# Patient Record
Sex: Male | Born: 1965 | Race: White | Hispanic: No | Marital: Single | Smoking: Current every day smoker
Health system: Southern US, Community
[De-identification: ages and names within clinical notes are randomized; demographics above are authoritative.]

## PROBLEM LIST (undated history)

## (undated) DIAGNOSIS — F419 Anxiety disorder, unspecified: Secondary | ICD-10-CM

## (undated) DIAGNOSIS — K219 Gastro-esophageal reflux disease without esophagitis: Secondary | ICD-10-CM

## (undated) DIAGNOSIS — T7840XA Allergy, unspecified, initial encounter: Secondary | ICD-10-CM

## (undated) DIAGNOSIS — M1990 Unspecified osteoarthritis, unspecified site: Secondary | ICD-10-CM

## (undated) DIAGNOSIS — F32A Depression, unspecified: Secondary | ICD-10-CM

## (undated) DIAGNOSIS — I10 Essential (primary) hypertension: Secondary | ICD-10-CM

## (undated) DIAGNOSIS — M51369 Other intervertebral disc degeneration, lumbar region without mention of lumbar back pain or lower extremity pain: Secondary | ICD-10-CM

## (undated) DIAGNOSIS — M5136 Other intervertebral disc degeneration, lumbar region: Secondary | ICD-10-CM

## (undated) HISTORY — DX: Allergy, unspecified, initial encounter: T78.40XA

## (undated) HISTORY — PX: COLONOSCOPY W/ POLYPECTOMY: SHX1380

## (undated) HISTORY — DX: Depression, unspecified: F32.A

## (undated) HISTORY — DX: Anxiety disorder, unspecified: F41.9

## (undated) HISTORY — DX: Unspecified osteoarthritis, unspecified site: M19.90

## (undated) HISTORY — PX: HERNIA REPAIR: SHX51

---

## 2019-06-12 DIAGNOSIS — Z720 Tobacco use: Secondary | ICD-10-CM | POA: Diagnosis not present

## 2019-06-12 DIAGNOSIS — I10 Essential (primary) hypertension: Secondary | ICD-10-CM | POA: Diagnosis not present

## 2020-03-21 ENCOUNTER — Encounter: Payer: Self-pay | Admitting: Emergency Medicine

## 2020-03-21 ENCOUNTER — Ambulatory Visit
Admission: EM | Admit: 2020-03-21 | Discharge: 2020-03-21 | Disposition: A | Discharge status: Home / Self Care | Payer: BC Managed Care – PPO

## 2020-03-21 ENCOUNTER — Other Ambulatory Visit: Payer: Self-pay

## 2020-03-21 DIAGNOSIS — M545 Low back pain, unspecified: Secondary | ICD-10-CM

## 2020-03-21 HISTORY — DX: Essential (primary) hypertension: I10

## 2020-03-21 HISTORY — DX: Gastro-esophageal reflux disease without esophagitis: K21.9

## 2020-03-21 MED ORDER — CYCLOBENZAPRINE HCL 5 MG PO TABS: 5.0000 mg | ORAL_TABLET | Freq: Three times a day (TID) | ORAL | 0 refills | Status: DC | PRN

## 2020-03-21 MED ORDER — KETOROLAC TROMETHAMINE 60 MG/2ML IM SOLN
30.0000 mg | Freq: Once | INTRAMUSCULAR | Status: AC
Start: 2020-03-21 — End: 2020-03-21
  Administered 2020-03-21: 11:00:00 30 mg via INTRAMUSCULAR

## 2020-03-21 MED ORDER — METHYLPREDNISOLONE 4 MG PO TBPK: ORAL_TABLET | ORAL | 0 refills | Status: DC

## 2020-03-21 NOTE — ED Provider Notes (Signed)
[facility]    CSN: [id number] Arrival date & time: 03/21/20  1005      History   Chief Complaint Chief Complaint  Patient presents with  . Back Pain    HPI Ethan Snyder is a 54 y.o. male.   Ethan Snyder presents with complaints of left low back pain. Initially started around 8 days ago. No specific known trigger or injury. 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 Pain had been improving until last night. He got up from the cough and pain much worse. He experiences spasms, triggered by certain movements. Pain with hip flexion as well as with back flexion. Unable to even put his shoes on this morning due to pain. No radiation of pain. No numbness tingling or weakness. No saddle symptoms. No loss of bladder or bowel.  Took one ibuprofen last night which didn't help. Has had one similar incident in the past, but was many years ago. No midline back pain. No abdominal pain.    ROS per HPI, negative if not otherwise mentioned.      Past Medical History:  Diagnosis Date  . GERD (gastroesophageal reflux disease)   . Hypertension     There are no problems to display for this patient.   Past Surgical History:  Procedure Laterality Date  . HERNIA REPAIR         Home Medications    Prior to Admission medications   Medication Sig Start Date End Date Taking? Authorizing Provider  amLODipine (NORVASC) 10 MG tablet Take 10 mg by mouth daily. 01/09/20  Yes [provider]  loratadine (CLARITIN) 10 MG tablet Take 1 tablet by mouth daily.   Yes [provider]  losartan (COZAAR) 50 MG tablet Take 50 mg by mouth daily. 01/09/20  Yes [provider]  omeprazole (PRILOSEC OTC) 20 MG tablet Take 1 tablet by mouth daily.   Yes [provider]  cyclobenzaprine (FLEXERIL) 5 MG tablet Take 1-2 tablets (5-10 mg total) by mouth 3 (three) times daily as needed for muscle spasms. 03/21/20   Zigmund Gottron, NP   methylPREDNISolone (MEDROL DOSEPAK) 4 MG TBPK tablet Per box instructions 03/21/20   Zigmund Gottron, NP    Family History Family History  Problem Relation Age of Onset  . Healthy Mother   . Lung cancer Father     Social History Social History   Tobacco Use  . Smoking status: Current Every Day Smoker    Packs/day: 1.00    Years: 40.00    Pack years: 40.00    Types: Cigarettes  . Smokeless tobacco: Never Used  Vaping Use  . Vaping Use: Never used  Substance Use Topics  . Alcohol use: Yes    Alcohol/week: 2.0 standard drinks    Types: 2 Glasses of wine per week  . Drug use: Never     Allergies   Lisinopril, Ibuprofen, and Other   Review of Systems Review of Systems   Physical Exam Triage Vital Signs ED Triage Vitals  Enc Vitals Group     BP 03/21/20 1108 (!) 141/90     Pulse Rate 03/21/20 1108 88     Resp 03/21/20 1108 18     Temp 03/21/20 1108 98.4 F (36.9 C)     Temp Source 03/21/20 1108 Oral     SpO2 03/21/20 1108 99 %     Weight 03/21/20 1108 230 lb (104.3 kg)     Height 03/21/20 1108 6' (1.829  m)     Head Circumference --      Peak Flow --      Pain Score 03/21/20 1107 10     Pain Loc --      Pain Edu? --      Excl. in [place]? --    No data found.  Updated Vital Signs BP (!) 141/90 (BP Location: Left Arm)   Pulse 88   Temp 98.4 F (36.9 C) (Oral)   Resp 18   Ht 6' (1.829 m)   Wt 230 lb (104.3 kg)   SpO2 99%   BMI 31.19 kg/m   Visual Acuity Right Eye Distance:   Left Eye Distance:   Bilateral Distance:    Right Eye Near:   Left Eye Near:    Bilateral Near:     Physical Exam Constitutional:      Appearance: He is well-developed.  Cardiovascular:     Rate and Rhythm: Normal rate.  Pulmonary:     Effort: Pulmonary effort is normal.  Musculoskeletal:     Lumbar back: Spasms and tenderness present. No swelling or bony tenderness. Decreased range of motion. Negative right straight leg raise test and negative left straight leg raise  test.       Back:     Comments: Point tenderness to low back musculature, L>R; pain with hip flexion; pain with transition from sit to stand and stand to sit; pain with back flexion; no radiation of pain; strength equal bilaterally; gross sensation intact to lower extremities   Skin:    General: Skin is warm and dry.  Neurological:     Mental Status: He is alert and oriented to person, place, and time.      UC Treatments / Results  Labs (all labs ordered are listed, but only abnormal results are displayed) Labs Reviewed - No data to display  EKG   Radiology No results found.  Procedures Procedures (including critical care time)  Medications Ordered in UC Medications  ketorolac (TORADOL) injection 30 mg (has no administration in time range)    Initial Impression / Assessment and Plan / UC Course  I have reviewed the triage vital signs and the nursing notes.  Pertinent labs & imaging results that were available during my care of the patient were reviewed by me and considered in my medical decision making (see chart for details).     Lumbar strain and spasm with pain management plan discussed. Return precautions provided. Patient verbalized understanding and agreeable to plan. Ambulatory out of clinic.   Final Clinical Impressions(s) / UC Diagnoses   Final diagnoses:  Acute left-sided low back pain without sciatica     Discharge Instructions     Light and regular activity as tolerated.  See exercises provided.  Heat application while active can help with muscle spasms.  Sleep with pillow under your knees.   May start medrol steroid pack today. Take with food.  Flexeril as needed as a muscle relaxer. May cause drowsiness. Please do not take if driving or drinking alcohol.  May  need to take this at night if you are working during the day.  Return for any worsening of symptoms. This may take even up to a few weeks for complete resolution, however.    ED  Prescriptions    Medication Sig Dispense Auth. Provider   methylPREDNISolone (MEDROL DOSEPAK) 4 MG TBPK tablet Per box instructions 21 tablet Shigeru Lampert B, NP   cyclobenzaprine (FLEXERIL) 5 MG tablet Take 1-2 tablets (  5-10 mg total) by mouth 3 (three) times daily as needed for muscle spasms. 20 tablet Zigmund Gottron, NP     PDMP not reviewed this encounter.   Zigmund Gottron, NP 03/21/20 1145

## 2020-03-21 NOTE — Discharge Instructions (Addendum)
Light and regular activity as tolerated.  See exercises provided.  Heat application while active can help with muscle spasms.  Sleep with pillow under your knees.   May start medrol steroid pack today. Take with food.  Flexeril as needed as a muscle relaxer. May cause drowsiness. Please do not take if driving or drinking alcohol.  May  need to take this at night if you are working during the day.  Return for any worsening of symptoms. This may take even up to a few weeks for complete resolution, however.

## 2020-03-21 NOTE — ED Triage Notes (Signed)
Patient in today c/o lower back pain mainly on the left x 1 week. Patient states it got a lot worse last night. No injury noted. Patient denies any urinary symptoms. Patient took Ibuprofen and rub on pain reliever last night without relief.

## 2020-07-15 DIAGNOSIS — Z0000 Encounter for general adult medical examination without abnormal findings: Secondary | ICD-10-CM | POA: Diagnosis not present

## 2020-07-15 DIAGNOSIS — Z125 Encounter for screening for malignant neoplasm of prostate: Secondary | ICD-10-CM | POA: Diagnosis not present

## 2020-07-15 DIAGNOSIS — E785 Hyperlipidemia, unspecified: Secondary | ICD-10-CM | POA: Diagnosis not present

## 2020-07-15 DIAGNOSIS — K219 Gastro-esophageal reflux disease without esophagitis: Secondary | ICD-10-CM | POA: Diagnosis not present

## 2020-07-15 DIAGNOSIS — I10 Essential (primary) hypertension: Secondary | ICD-10-CM | POA: Diagnosis not present

## 2020-07-15 DIAGNOSIS — Z1159 Encounter for screening for other viral diseases: Secondary | ICD-10-CM | POA: Diagnosis not present

## 2020-07-15 LAB — HM HEPATITIS C SCREENING LAB: HM Hepatitis Screen: NEGATIVE

## 2020-11-22 ENCOUNTER — Ambulatory Visit: Payer: BC Managed Care – PPO | Admitting: Internal Medicine

## 2020-12-28 ENCOUNTER — Ambulatory Visit
Admission: RE | Admit: 2020-12-28 | Discharge: 2020-12-28 | Disposition: A | Discharge status: Home / Self Care | Payer: BC Managed Care – PPO | Source: Ambulatory Visit | Attending: Internal Medicine | Admitting: Internal Medicine

## 2020-12-28 ENCOUNTER — Encounter: Payer: Self-pay | Admitting: Internal Medicine

## 2020-12-28 ENCOUNTER — Other Ambulatory Visit: Payer: Self-pay

## 2020-12-28 ENCOUNTER — Ambulatory Visit: Payer: BC Managed Care – PPO | Admitting: Internal Medicine

## 2020-12-28 ENCOUNTER — Ambulatory Visit
Admission: RE | Admit: 2020-12-28 | Discharge: 2020-12-28 | Disposition: A | Discharge status: Home / Self Care | Payer: BC Managed Care – PPO | Attending: Internal Medicine | Admitting: Internal Medicine

## 2020-12-28 VITALS — BP 121/73 | HR 71 | Temp 98.2°F | Ht 71.5 in | Wt 217.0 lb

## 2020-12-28 DIAGNOSIS — M5442 Lumbago with sciatica, left side: Secondary | ICD-10-CM | POA: Insufficient documentation

## 2020-12-28 DIAGNOSIS — I10 Essential (primary) hypertension: Secondary | ICD-10-CM | POA: Diagnosis not present

## 2020-12-28 DIAGNOSIS — M5441 Lumbago with sciatica, right side: Secondary | ICD-10-CM

## 2020-12-28 DIAGNOSIS — Z122 Encounter for screening for malignant neoplasm of respiratory organs: Secondary | ICD-10-CM | POA: Diagnosis not present

## 2020-12-28 DIAGNOSIS — M545 Low back pain, unspecified: Secondary | ICD-10-CM | POA: Diagnosis not present

## 2020-12-28 DIAGNOSIS — G8929 Other chronic pain: Secondary | ICD-10-CM

## 2020-12-28 DIAGNOSIS — Z1211 Encounter for screening for malignant neoplasm of colon: Secondary | ICD-10-CM | POA: Diagnosis not present

## 2020-12-28 LAB — URINALYSIS, ROUTINE W REFLEX MICROSCOPIC
Bilirubin, UA: NEGATIVE
Glucose, UA: NEGATIVE
Ketones, UA: NEGATIVE
Leukocytes,UA: NEGATIVE
Nitrite, UA: NEGATIVE
Protein,UA: NEGATIVE
RBC, UA: NEGATIVE
Specific Gravity, UA: 1.025 (ref 1.005–1.030)
Urobilinogen, Ur: 0.2 mg/dL (ref 0.2–1.0)
pH, UA: 5.5 (ref 5.0–7.5)

## 2020-12-28 MED ORDER — NICOTINE 21 MG/24HR TD PT24: 21.0000 mg | MEDICATED_PATCH | Freq: Every day | TRANSDERMAL | 0 refills | Status: DC

## 2020-12-28 MED ORDER — CYCLOBENZAPRINE HCL 10 MG PO TABS: 10.0000 mg | ORAL_TABLET | Freq: Every day | ORAL | 0 refills | Status: AC

## 2020-12-28 MED ORDER — METHYLPREDNISOLONE 4 MG PO TBPK: ORAL_TABLET | ORAL | 0 refills | Status: DC

## 2020-12-28 NOTE — Progress Notes (Signed)
BP 121/73   Pulse 71   Temp 98.2 F (36.8 C) (Oral)   Ht 5' 11.5" (1.816 m)   Wt 217 lb (98.4 kg)   SpO2 98%   BMI 29.84 kg/m    Subjective:    Patient ID: Ethan Snyder, male    DOB: 1966-02-13, 55 y.o.   MRN: [id number]  Chief Complaint  Patient presents with   New Patient (Initial Visit)    Pt states he has been having issues with his back bothering him for a while.     HPI: Ethan Snyder is a 55 y.o. male  Moved from [place]. Going back and forth. Worked in apt business. Lives in [place] now. Has lost 20 lbs x 6 months involuntarily  Back Pain This is a chronic (has a ho such, started x 7 - 8 months ago , got worse had spasms, was miserable - saw UC @ [facility] - didnt have imaging - got a steorid dose pak tht helped. has pain in lower back.) problem. The current episode started more than 1 year ago (has had pain in the bil lower legs sec to radiation from the lower back). Pertinent negatives include no chest pain or headaches.  Hypertension This is a chronic (has bene on norvasc and losartan for such) problem. The current episode started more than 1 year ago. The problem has been gradually improving since onset. The problem is controlled. Pertinent negatives include no anxiety, blurred vision, chest pain, headaches, malaise/fatigue, neck pain, orthopnea, palpitations, peripheral edema, PND, shortness of breath or sweats.  Gastroesophageal Reflux He reports no chest pain.   Chief Complaint  Patient presents with   New Patient (Initial Visit)    Pt states he has been having issues with his back bothering him for a while.     Relevant past medical, surgical, family and social history reviewed and updated as indicated. Interim medical history since our last visit reviewed. Allergies and medications reviewed and updated.  Review of Systems  Constitutional:  Negative for malaise/fatigue.  Eyes:  Negative for blurred vision.  Respiratory:  Negative for shortness of  breath.   Cardiovascular:  Negative for chest pain, palpitations, orthopnea and PND.  Musculoskeletal:  Positive for back pain. Negative for neck pain.  Neurological:  Negative for headaches.   Per HPI unless specifically indicated above     Objective:    BP 121/73   Pulse 71   Temp 98.2 F (36.8 C) (Oral)   Ht 5' 11.5" (1.816 m)   Wt 217 lb (98.4 kg)   SpO2 98%   BMI 29.84 kg/m   Wt Readings from Last 3 Encounters:  12/28/20 217 lb (98.4 kg)  03/21/20 230 lb (104.3 kg)    Physical Exam Vitals and nursing note reviewed.  Constitutional:      General: He is not in acute distress.    Appearance: Normal appearance. He is not ill-appearing or diaphoretic.  HENT:     Head: Normocephalic and atraumatic.     Right Ear: Tympanic membrane and external ear normal. There is no impacted cerumen.     Left Ear: External ear normal.     Nose: No congestion or rhinorrhea.     Mouth/Throat:     Pharynx: No oropharyngeal exudate or posterior oropharyngeal erythema.  Eyes:     Conjunctiva/sclera: Conjunctivae normal.     Pupils: Pupils are equal, round, and reactive to light.  Cardiovascular:     Rate and Rhythm: Normal rate and regular  rhythm.     Heart sounds: No murmur heard.   No friction rub. No gallop.  Pulmonary:     Effort: No respiratory distress.     Breath sounds: No stridor. No wheezing or rhonchi.  Chest:     Chest wall: No tenderness.  Abdominal:     General: Abdomen is flat. Bowel sounds are normal.     Palpations: Abdomen is soft. There is no mass.     Tenderness: There is no abdominal tenderness.  Musculoskeletal:     Cervical back: Normal range of motion and neck supple. No rigidity or tenderness.     Left lower leg: No edema.  Skin:    General: Skin is warm and dry.  Neurological:     Mental Status: He is alert.    No results found for this or any previous visit.      Current Outpatient Medications:    amLODipine (NORVASC) 10 MG tablet, Take 10 mg by  mouth daily., Disp: , Rfl:    loratadine (CLARITIN) 10 MG tablet, Take 1 tablet by mouth daily., Disp: , Rfl:    losartan (COZAAR) 50 MG tablet, Take 50 mg by mouth daily., Disp: , Rfl:    omeprazole (PRILOSEC OTC) 20 MG tablet, Take 1 tablet by mouth daily., Disp: , Rfl:     Assessment & Plan:  Back pain start pt on medrol dose pak   probably secondary to muscle spasms relieved check UTI patient does not have any symptoms either. will need ? MRI of back if pain persists.  most likely musckulskelteal though vs L spine / nerve compression  adviced streches for back  Patient advised to take medication as directed here. Patient advised to rest initially and then slowly increase activity level. Monitor changes in symptoms such as numbness, tingling or weakness in legs, changes in bowel or bladder habits or worsening back pain. Proper ergonomics discussed. Referral to physical therapy as needed. Patient will call if symptoms worsen or if pain persists greater than 8 weeks.    HTN HTN :  Continue current meds.  Medication compliance emphasised. pt advised to keep Bp logs. Pt verbalised understanding of the same. Pt to have a low salt diet . Exercise to reach a goal of at least 150 mins a week.  lifestyle modifications explained and pt understands importance of the above.  Lung cacner screening    Orders Placed This Encounter  Procedures   DG Lumbar Spine Complete    Standing Status:   Future    Standing Expiration Date:   02/27/2021    Order Specific Question:   Reason for Exam (SYMPTOM  OR DIAGNOSIS REQUIRED)    Answer:   lower back pain with sciatica.    Order Specific Question:   Preferred imaging location?    Answer:   [facility]   Ambulatory referral to Orthopedics    Referral Priority:   Routine    Referral Type:   Consultation    Number of Visits Requested:   1   2. Healht Maintenence : PSA : last 6 months ago was nl per pt. @ [facility]  Cscope :10 yrs ago   3. Smoking  cesation Smoking cessation advised. Insurance didn't cover chantix Low dose CT chest ordered   nicotine patches in the past. continues to smoke. more than > 5 - 10 mins of time was spent with pt regarding smoking cessation and complications.    4. 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  Will check Cscoape Low dose CT chest.  Labs, Including PSA Screen for cancer.    Total time spent with patient was > 30 mins including charting and cordinating care/ making reerrals.      Problem List Items Addressed This Visit   None    No orders of the defined types were placed in this encounter.    No orders of the defined types were placed in this encounter.    Follow up plan: No follow-ups on file.

## 2020-12-28 NOTE — Progress Notes (Signed)
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  If pain worse or gets intolerable, needs an Mri  Thnx.

## 2020-12-28 NOTE — Patient Instructions (Signed)

## 2020-12-29 ENCOUNTER — Telehealth: Payer: Self-pay

## 2020-12-29 LAB — LIPID PANEL
Chol/HDL Ratio: 5.1 ratio — ABNORMAL HIGH (ref 0.0–5.0)
Cholesterol, Total: 190 mg/dL (ref 100–199)
HDL: 37 mg/dL — ABNORMAL LOW (ref >39)
LDL Chol Calc (NIH): 127 mg/dL — ABNORMAL HIGH (ref 0–99)
Triglycerides: 145 mg/dL (ref 0–149)
VLDL Cholesterol Cal: 26 mg/dL (ref 5–40)

## 2020-12-29 LAB — COMPREHENSIVE METABOLIC PANEL
ALT: 18 IU/L (ref 0–44)
AST: 17 IU/L (ref 0–40)
Albumin/Globulin Ratio: 2.2 (ref 1.2–2.2)
Albumin: 4.8 g/dL (ref 3.8–4.9)
Alkaline Phosphatase: 83 IU/L (ref 44–121)
BUN/Creatinine Ratio: 11 (ref 9–20)
BUN: 12 mg/dL (ref 6–24)
Bilirubin Total: 0.4 mg/dL (ref 0.0–1.2)
CO2: 22 mmol/L (ref 20–29)
Calcium: 9.5 mg/dL (ref 8.7–10.2)
Chloride: 101 mmol/L (ref 96–106)
Creatinine, Ser: 1.08 mg/dL (ref 0.76–1.27)
Globulin, Total: 2.2 g/dL (ref 1.5–4.5)
Glucose: 103 mg/dL — ABNORMAL HIGH (ref 65–99)
Potassium: 4.6 mmol/L (ref 3.5–5.2)
Sodium: 141 mmol/L (ref 134–144)
Total Protein: 7 g/dL (ref 6.0–8.5)
eGFR: 81 mL/min/{1.73_m2} (ref >59)

## 2020-12-29 LAB — CBC WITH DIFFERENTIAL/PLATELET
Basophils Absolute: 0 10*3/uL (ref 0.0–0.2)
Basos: 1 %
EOS (ABSOLUTE): 0 10*3/uL (ref 0.0–0.4)
Eos: 1 %
Hematocrit: 49.8 % (ref 37.5–51.0)
Hemoglobin: 16.5 g/dL (ref 13.0–17.7)
Immature Grans (Abs): 0 10*3/uL (ref 0.0–0.1)
Immature Granulocytes: 0 %
Lymphocytes Absolute: 1.6 10*3/uL (ref 0.7–3.1)
Lymphs: 27 %
MCH: 27.7 pg (ref 26.6–33.0)
MCHC: 33.1 g/dL (ref 31.5–35.7)
MCV: 84 fL (ref 79–97)
Monocytes Absolute: 0.6 10*3/uL (ref 0.1–0.9)
Monocytes: 10 %
Neutrophils Absolute: 3.7 10*3/uL (ref 1.4–7.0)
Neutrophils: 61 %
Platelets: 211 10*3/uL (ref 150–450)
RBC: 5.96 x10E6/uL — ABNORMAL HIGH (ref 4.14–5.80)
RDW: 13.1 % (ref 11.6–15.4)
WBC: 6 10*3/uL (ref 3.4–10.8)

## 2020-12-29 LAB — PSA: Prostate Specific Ag, Serum: 1.1 ng/mL (ref 0.0–4.0)

## 2020-12-29 LAB — TSH: TSH: 1.8 u[IU]/mL (ref 0.450–4.500)

## 2020-12-29 NOTE — Telephone Encounter (Signed)
PATIENT WILL CALL us BACK WHEN HE IS READY TO SCHEDULE

## 2021-01-03 ENCOUNTER — Telehealth: Payer: Self-pay | Admitting: Internal Medicine

## 2021-01-03 NOTE — Telephone Encounter (Signed)
done

## 2021-01-03 NOTE — Telephone Encounter (Signed)
Pt is calling to check on the status of the Ambulatory referral to Orthopedics (Order # [id number]) on 12/28/2020 Please advise CB- [phone]

## 2021-01-03 NOTE — Telephone Encounter (Signed)
Dr. Neomia Dear, can you finish this note as soon as you get a chance please? Ethan Snyder is waiting on it to be able to send this referral.

## 2021-01-04 NOTE — Telephone Encounter (Signed)
Called and notified patient. He states that he has already has an appointment with them on 01/11/21

## 2021-01-11 DIAGNOSIS — K4090 Unilateral inguinal hernia, without obstruction or gangrene, not specified as recurrent: Secondary | ICD-10-CM | POA: Diagnosis not present

## 2021-01-11 DIAGNOSIS — M5416 Radiculopathy, lumbar region: Secondary | ICD-10-CM | POA: Diagnosis not present

## 2021-01-12 ENCOUNTER — Other Ambulatory Visit: Payer: Self-pay

## 2021-01-12 ENCOUNTER — Encounter: Payer: Self-pay | Admitting: Internal Medicine

## 2021-01-12 ENCOUNTER — Ambulatory Visit: Payer: BC Managed Care – PPO | Admitting: Internal Medicine

## 2021-01-12 VITALS — BP 133/84 | HR 80 | Temp 98.6°F | Ht 71.5 in | Wt 211.8 lb

## 2021-01-12 DIAGNOSIS — E781 Pure hyperglyceridemia: Secondary | ICD-10-CM | POA: Diagnosis not present

## 2021-01-12 DIAGNOSIS — M5441 Lumbago with sciatica, right side: Secondary | ICD-10-CM

## 2021-01-12 DIAGNOSIS — I700 Atherosclerosis of aorta: Secondary | ICD-10-CM

## 2021-01-12 DIAGNOSIS — M5442 Lumbago with sciatica, left side: Secondary | ICD-10-CM

## 2021-01-12 DIAGNOSIS — I10 Essential (primary) hypertension: Secondary | ICD-10-CM | POA: Insufficient documentation

## 2021-01-12 DIAGNOSIS — G8929 Other chronic pain: Secondary | ICD-10-CM | POA: Insufficient documentation

## 2021-01-12 MED ORDER — LOSARTAN POTASSIUM 50 MG PO TABS: 50.0000 mg | ORAL_TABLET | Freq: Every day | ORAL | 3 refills | Status: DC

## 2021-01-12 NOTE — Progress Notes (Signed)
BP 133/84   Pulse 80   Temp 98.6 F (37 C) (Oral)   Ht 5' 11.5" (1.816 m)   Wt 211 lb 12.8 oz (96.1 kg)   SpO2 100%   BMI 29.13 kg/m    Subjective:    Patient ID: Ethan Snyder, male    DOB: 05-13-1965, 55 y.o.   MRN: [id number]  Chief Complaint  Patient presents with   Hypertension   Back Pain    HPI: Ethan Snyder is a 55 y.o. male  Hypertension This is a chronic problem. The current episode started more than 1 year ago. The problem has been rapidly improving since onset. The problem is controlled. Pertinent negatives include no anxiety, blurred vision, chest pain, headaches, malaise/fatigue, neck pain, orthopnea, palpitations, peripheral edema, PND or shortness of breath.  Back Pain This is a chronic (seen by [facility] has mild DJD of the spine.) problem. The current episode started more than 1 year ago. The pain is at a severity of 4/10. Pertinent negatives include no abdominal pain, bladder incontinence, bowel incontinence, chest pain, headaches, numbness, paresis, paresthesias or pelvic pain.   Chief Complaint  Patient presents with   Hypertension   Back Pain    Relevant past medical, surgical, family and social history reviewed and updated as indicated. Interim medical history since our last visit reviewed. Allergies and medications reviewed and updated.  Review of Systems  Constitutional:  Negative for malaise/fatigue.  Eyes:  Negative for blurred vision.  Respiratory:  Negative for shortness of breath.   Cardiovascular:  Negative for chest pain, palpitations, orthopnea and PND.  Gastrointestinal:  Negative for abdominal pain and bowel incontinence.  Genitourinary:  Negative for bladder incontinence and pelvic pain.  Musculoskeletal:  Positive for back pain. Negative for neck pain.  Neurological:  Negative for numbness, headaches and paresthesias.   Per HPI unless specifically indicated above     Objective:    BP 133/84   Pulse 80    Temp 98.6 F (37 C) (Oral)   Ht 5' 11.5" (1.816 m)   Wt 211 lb 12.8 oz (96.1 kg)   SpO2 100%   BMI 29.13 kg/m   Wt Readings from Last 3 Encounters:  01/12/21 211 lb 12.8 oz (96.1 kg)  12/28/20 217 lb (98.4 kg)  03/21/20 230 lb (104.3 kg)    Physical Exam Vitals and nursing note reviewed.  Constitutional:      General: He is not in acute distress.    Appearance: Normal appearance. He is not ill-appearing or diaphoretic.  HENT:     Head: Normocephalic and atraumatic.     Right Ear: Tympanic membrane and external ear normal. There is no impacted cerumen.     Left Ear: External ear normal.     Nose: No congestion or rhinorrhea.     Mouth/Throat:     Pharynx: No oropharyngeal exudate or posterior oropharyngeal erythema.  Eyes:     Conjunctiva/sclera: Conjunctivae normal.     Pupils: Pupils are equal, round, and reactive to light.  Cardiovascular:     Rate and Rhythm: Normal rate and regular rhythm.     Heart sounds: No murmur heard.   No friction rub. No gallop.  Pulmonary:     Effort: No respiratory distress.     Breath sounds: No stridor. No wheezing or rhonchi.  Chest:     Chest wall: No tenderness.  Abdominal:     General: Abdomen is flat. Bowel sounds are normal.  Palpations: Abdomen is soft. There is no mass.     Tenderness: There is no abdominal tenderness.  Musculoskeletal:     Cervical back: Normal range of motion and neck supple. No rigidity or tenderness.     Left lower leg: No edema.  Skin:    General: Skin is warm and dry.  Neurological:     Mental Status: He is alert.    Results for orders placed or performed in visit on 12/28/20  TSH  Result Value Ref Range   TSH 1.800 0.450 - 4.500 uIU/mL  PSA  Result Value Ref Range   Prostate Specific Ag, Serum 1.1 0.0 - 4.0 ng/mL  Lipid panel  Result Value Ref Range   Cholesterol, Total 190 100 - 199 mg/dL   Triglycerides 145 0 - 149 mg/dL   HDL 37 (L) >39 mg/dL   VLDL Cholesterol Cal 26 5 - 40 mg/dL    LDL Chol Calc (NIH) 127 (H) 0 - 99 mg/dL   Chol/HDL Ratio 5.1 (H) 0.0 - 5.0 ratio  CBC with Differential/Platelet  Result Value Ref Range   WBC 6.0 3.4 - 10.8 x10E3/uL   RBC 5.96 (H) 4.14 - 5.80 x10E6/uL   Hemoglobin 16.5 13.0 - 17.7 g/dL   Hematocrit 49.8 37.5 - 51.0 %   MCV 84 79 - 97 fL   MCH 27.7 26.6 - 33.0 pg   MCHC 33.1 31.5 - 35.7 g/dL   RDW 13.1 11.6 - 15.4 %   Platelets 211 150 - 450 x10E3/uL   Neutrophils 61 Not Estab. %   Lymphs 27 Not Estab. %   Monocytes 10 Not Estab. %   Eos 1 Not Estab. %   Basos 1 Not Estab. %   Neutrophils Absolute 3.7 1.4 - 7.0 x10E3/uL   Lymphocytes Absolute 1.6 0.7 - 3.1 x10E3/uL   Monocytes Absolute 0.6 0.1 - 0.9 x10E3/uL   EOS (ABSOLUTE) 0.0 0.0 - 0.4 x10E3/uL   Basophils Absolute 0.0 0.0 - 0.2 x10E3/uL   Immature Granulocytes 0 Not Estab. %   Immature Grans (Abs) 0.0 0.0 - 0.1 x10E3/uL  Comprehensive metabolic panel  Result Value Ref Range   Glucose 103 (H) 65 - 99 mg/dL   BUN 12 6 - 24 mg/dL   Creatinine, Ser 1.08 0.76 - 1.27 mg/dL   eGFR 81 >59 mL/min/1.73   BUN/Creatinine Ratio 11 9 - 20   Sodium 141 134 - 144 mmol/L   Potassium 4.6 3.5 - 5.2 mmol/L   Chloride 101 96 - 106 mmol/L   CO2 22 20 - 29 mmol/L   Calcium 9.5 8.7 - 10.2 mg/dL   Total Protein 7.0 6.0 - 8.5 g/dL   Albumin 4.8 3.8 - 4.9 g/dL   Globulin, Total 2.2 1.5 - 4.5 g/dL   Albumin/Globulin Ratio 2.2 1.2 - 2.2   Bilirubin Total 0.4 0.0 - 1.2 mg/dL   Alkaline Phosphatase 83 44 - 121 IU/L   AST 17 0 - 40 IU/L   ALT 18 0 - 44 IU/L  Urinalysis, Routine w reflex microscopic  Result Value Ref Range   Specific Gravity, UA 1.025 1.005 - 1.030   pH, UA 5.5 5.0 - 7.5   Color, UA Yellow Yellow   Appearance Ur Clear Clear   Leukocytes,UA Negative Negative   Protein,UA Negative Negative/Trace   Glucose, UA Negative Negative   Ketones, UA Negative Negative   RBC, UA Negative Negative   Bilirubin, UA Negative Negative   Urobilinogen, Ur 0.2 0.2 - 1.0 mg/dL  Nitrite, UA  Negative Negative        Current Outpatient Medications:    amLODipine (NORVASC) 10 MG tablet, Take 10 mg by mouth daily., Disp: , Rfl:    loratadine (CLARITIN) 10 MG tablet, Take 1 tablet by mouth daily., Disp: , Rfl:    losartan (COZAAR) 50 MG tablet, Take 50 mg by mouth daily., Disp: , Rfl:    nicotine (NICODERM CQ) 21 mg/24hr patch, Place 1 patch (21 mg total) onto the skin daily., Disp: 28 patch, Rfl: 0   omeprazole (PRILOSEC OTC) 20 MG tablet, Take 1 tablet by mouth daily., Disp: , Rfl:    gabapentin (NEURONTIN) 300 MG capsule, Take 300 mg by mouth 3 (three) times daily., Disp: , Rfl:     Assessment & Plan:  Mild DJD of L spine seeing [facility] for such, suggested PT  - is on neurontin 300 mg tid to change to  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 HTN : is on losartan 50 mg and amlodipine for such  Continue current meds.  Medication compliance emphasised. pt advised to keep Bp logs. Pt verbalised understanding of the same. Pt to have a low salt diet . Exercise to reach a goal of at least 150 mins a week.  lifestyle modifications explained and pt understands importance of the above.  3. Xray L spine : Aortic atherosclerosis. Stopping smoking would help with such , continues to smoke but cuting back is on 1/2 ppd. Is on nicoderm patches  Consider statins pt will try lifestyle modifications inclyuding stopping smoking and fu with me    Problem List Items Addressed This Visit   None    Orders Placed This Encounter  Procedures   Lipid Panel Piccolo, Waived   Comprehensive metabolic panel   Comprehensive metabolic panel   Urinalysis, Routine w reflex microscopic     Meds ordered this encounter  Medications   DISCONTD: losartan (COZAAR) 50 MG tablet    Sig: Take 1 tablet (50 mg total) by mouth daily.    Dispense:  90 tablet    Refill:  3   losartan (COZAAR) 50 MG tablet    Sig: Take 1 tablet (50 mg total) by mouth daily.    Dispense:  90 tablet    Refill:  3     Follow  up plan: No follow-ups on file.

## 2021-01-13 DIAGNOSIS — E781 Pure hyperglyceridemia: Secondary | ICD-10-CM | POA: Insufficient documentation

## 2021-01-13 DIAGNOSIS — I700 Atherosclerosis of aorta: Secondary | ICD-10-CM | POA: Insufficient documentation

## 2021-02-01 DIAGNOSIS — M545 Low back pain, unspecified: Secondary | ICD-10-CM | POA: Diagnosis not present

## 2021-02-08 DIAGNOSIS — M545 Low back pain, unspecified: Secondary | ICD-10-CM | POA: Diagnosis not present

## 2021-02-09 ENCOUNTER — Other Ambulatory Visit: Payer: Self-pay | Admitting: *Deleted

## 2021-02-09 DIAGNOSIS — F17210 Nicotine dependence, cigarettes, uncomplicated: Secondary | ICD-10-CM

## 2021-02-21 DIAGNOSIS — M545 Low back pain, unspecified: Secondary | ICD-10-CM | POA: Diagnosis not present

## 2021-02-25 DIAGNOSIS — M545 Low back pain, unspecified: Secondary | ICD-10-CM | POA: Diagnosis not present

## 2021-02-25 DIAGNOSIS — M25552 Pain in left hip: Secondary | ICD-10-CM | POA: Diagnosis not present

## 2021-02-25 DIAGNOSIS — M25551 Pain in right hip: Secondary | ICD-10-CM | POA: Diagnosis not present

## 2021-02-28 DIAGNOSIS — M545 Low back pain, unspecified: Secondary | ICD-10-CM | POA: Diagnosis not present

## 2021-03-07 ENCOUNTER — Telehealth: Payer: Self-pay | Admitting: Acute Care

## 2021-03-07 NOTE — Telephone Encounter (Signed)
Spoke with pt and rescheduled shared decision visit and Low dose CT.  PT verbalized understanding. Nothing further needed.

## 2021-03-08 ENCOUNTER — Ambulatory Visit: Payer: BC Managed Care – PPO

## 2021-03-08 ENCOUNTER — Telehealth: Payer: BC Managed Care – PPO | Admitting: Acute Care

## 2021-03-30 ENCOUNTER — Other Ambulatory Visit: Payer: Self-pay

## 2021-03-30 ENCOUNTER — Ambulatory Visit
Admission: RE | Admit: 2021-03-30 | Discharge: 2021-03-30 | Disposition: A | Discharge status: Home / Self Care | Payer: BC Managed Care – PPO | Source: Ambulatory Visit | Attending: Acute Care | Admitting: Acute Care

## 2021-03-30 ENCOUNTER — Encounter: Payer: Self-pay | Admitting: Acute Care

## 2021-03-30 ENCOUNTER — Ambulatory Visit (INDEPENDENT_AMBULATORY_CARE_PROVIDER_SITE_OTHER): Payer: BC Managed Care – PPO | Admitting: Acute Care

## 2021-03-30 DIAGNOSIS — F17210 Nicotine dependence, cigarettes, uncomplicated: Secondary | ICD-10-CM

## 2021-03-30 DIAGNOSIS — Z122 Encounter for screening for malignant neoplasm of respiratory organs: Secondary | ICD-10-CM

## 2021-03-30 NOTE — Progress Notes (Signed)
Virtual Visit via Video Note  I connected with Nancy Nordmann on 03/30/21 at 10:00 AM EST by a video enabled telemedicine application and verified that I am speaking with the correct person using two identifiers.  Location: Patient: At home Provider:  [place], [place], [place], [address]    I discussed the limitations of evaluation and management by telemedicine and the availability of in person appointments. The patient expressed understanding and agreed to proceed.  Pt. Was unable to make the video connection with our platform, so the visit was completed as a Engineer, [employer] Making Visit Lung Cancer Screening Program [phone])   Eligibility: Age 55 y.o. Pack Years Smoking History Calculation 48 pack year smoking history (# packs/per year x # years smoked) Recent History of coughing up blood  no Unexplained weight loss? no ( >Than 15 pounds within the last 6 months ) Prior History Lung / other cancer no (Diagnosis within the last 5 years already requiring surveillance chest CT Scans). Smoking Status Current Smoker Former Smokers: Years since quit:  NA  Quit Date: NA  Visit Components: Discussion included one or more decision making aids. yes Discussion included risk/benefits of screening. yes Discussion included potential follow up diagnostic testing for abnormal scans. yes Discussion included meaning and risk of over diagnosis. yes Discussion included meaning and risk of False Positives. yes Discussion included meaning of total radiation exposure. yes  Counseling Included: Importance of adherence to annual lung cancer LDCT screening. yes Impact of comorbidities on ability to participate in the program. yes Ability and willingness to under diagnostic treatment. yes  Smoking Cessation Counseling: Current Smokers:  Discussed importance of smoking cessation. yes Information about tobacco cessation classes and interventions provided to patient.  yes Patient provided with "ticket" for LDCT Scan. yes Symptomatic Patient. no  Counseling NA Diagnosis Code: Tobacco Use Z72.0 Asymptomatic Patient yes  Counseling (Intermediate counseling: > three minutes counseling) [id number] Former Smokers:  Discussed the importance of maintaining cigarette abstinence. yes Diagnosis Code: Personal History of Nicotine Dependence. Y86.578 Information about tobacco cessation classes and interventions provided to patient. Yes Patient provided with "ticket" for LDCT Scan. yes Written Order for Lung Cancer Screening with LDCT placed in Epic. Yes (CT Chest Lung Cancer Screening Low Dose W/O CM) [id number] Z12.2-Screening of respiratory organs Z87.891-Personal history of nicotine dependence  I have spent 25 minutes of face to face/ virtual visit   time with Mr. Senat discussing the risks and benefits of lung cancer screening. We viewed / discussed a power point together that explained in detail the above noted topics. We paused at intervals to allow for questions to be asked and answered to ensure understanding.We discussed that the single most powerful action that he can take to decrease his risk of developing lung cancer is to quit smoking. We discussed whether or not he is ready to commit to setting a quit date. We discussed options for tools to aid in quitting smoking including nicotine replacement therapy, non-nicotine medications, support groups, Quit Smart classes, and behavior modification. We discussed that often times setting smaller, more achievable goals, such as eliminating 1 cigarette a day for a week and then 2 cigarettes a day for a week can be helpful in slowly decreasing the number of cigarettes smoked. This allows for a sense of accomplishment as well as providing a clinical benefit. I provided  him  with smoking cessation  information  with contact information for community resources, classes, free nicotine replacement therapy, and access  to mobile apps,  text messaging, and on-line smoking cessation help. I have also provided  him  the office contact information in the event he needs to contact me, or the screening staff. We discussed the time and location of the scan, and that either Doroteo Glassman RN, Joella Prince, RN  or I will call / send a letter with the results within 24-72 hours of receiving them. The patient verbalized understanding of all of  the above and had no further questions upon leaving the office. They have my contact information in the event they have any further questions.  I spent 3 minutes counseling on smoking cessation and the health risks of continued tobacco abuse.  I explained to the patient that there has been a high incidence of coronary artery disease noted on these exams. I explained that this is a non-gated exam therefore degree or severity cannot be determined. This patient is not on statin therapy. I have asked the patient to follow-up with their PCP regarding any incidental finding of coronary artery disease and management with diet or medication as their PCP  feels is clinically indicated. The patient verbalized understanding of the above and had no further questions upon completion of the visit.      Magdalen Spatz, NP 03/30/2021

## 2021-03-30 NOTE — Patient Instructions (Signed)
Thank you for participating in the [facility]. °It was our pleasure to meet you today. °We will call you with the results of your scan within the next few days. °Your scan will be assigned a Lung RADS category score by the physicians reading the scans.  °This Lung RADS score determines follow up scanning.  °See below for description of categories, and follow up screening recommendations. °We will be in touch to schedule your follow up screening annually or based on recommendations of our providers. °We will fax a copy of your scan results to your Primary Care Physician, or the physician who referred you to the program, to ensure they have the results. °Please call the office if you have any questions or concerns regarding your scanning experience or results.  °Our office number is [phone]. °Please speak with Denise Phelps, RN. She is our Lung Cancer Screening RN. °If she is unavailable when you call, please have the office staff send her a message. She will return your call at her earliest convenience. °Remember, if your scan is normal, we will scan you annually as long as you continue to meet the criteria for the program. (Age 55-77, Current smoker or smoker who has quit within the last 15 years). °If you are a smoker, remember, quitting is the single most powerful action that you can take to decrease your risk of lung cancer and other pulmonary, breathing related problems. °We know quitting is hard, and we are here to help.  °Please let us know if there is anything we can do to help you meet your goal of quitting. °If you are a former smoker, congratulations. We are proud of you! Remain smoke free! °Remember you can refer friends or family members through the number above.  °We will screen them to make sure they meet criteria for the program. °Thank you for helping us take better care of you by participating in Lung Screening. ° °You can receive free nicotine replacement therapy  ( patches, gum or mints) by calling 1-800-QUIT NOW. Please call so we can get you on the path to becoming  a non-smoker. I know it is hard, but you can do this! ° °Lung RADS Categories: ° °Lung RADS 1: no nodules or definitely non-concerning nodules.  °Recommendation is for a repeat annual scan in 12 months. ° °Lung RADS 2:  nodules that are non-concerning in appearance and behavior with a very low likelihood of becoming an active cancer. °Recommendation is for a repeat annual scan in 12 months. ° °Lung RADS 3: nodules that are probably non-concerning , includes nodules with a low likelihood of becoming an active cancer.  Recommendation is for a 6-month repeat screening scan. Often noted after an upper respiratory illness. We will be in touch to make sure you have no questions, and to schedule your 6-month scan. ° °Lung RADS 4 A: nodules with concerning findings, recommendation is most often for a follow up scan in 3 months or additional testing based on our provider's assessment of the scan. We will be in touch to make sure you have no questions and to schedule the recommended 3 month follow up scan. ° °Lung RADS 4 B:  indicates findings that are concerning. We will be in touch with you to schedule additional diagnostic testing based on our provider's  assessment of the scan. ° °Hypnosis for smoking cessation  °[employer] °[phone] ° °Acupuncture for smoking cessation  °[facility] °[phone]  °

## 2021-04-06 ENCOUNTER — Other Ambulatory Visit: Payer: Self-pay | Admitting: Acute Care

## 2021-04-06 DIAGNOSIS — Z87891 Personal history of nicotine dependence: Secondary | ICD-10-CM

## 2021-04-06 DIAGNOSIS — F17210 Nicotine dependence, cigarettes, uncomplicated: Secondary | ICD-10-CM

## 2021-04-07 ENCOUNTER — Other Ambulatory Visit: Payer: Self-pay

## 2021-04-07 ENCOUNTER — Other Ambulatory Visit: Payer: BC Managed Care – PPO

## 2021-04-07 DIAGNOSIS — F17210 Nicotine dependence, cigarettes, uncomplicated: Secondary | ICD-10-CM

## 2021-04-07 DIAGNOSIS — I10 Essential (primary) hypertension: Secondary | ICD-10-CM | POA: Diagnosis not present

## 2021-04-07 DIAGNOSIS — E781 Pure hyperglyceridemia: Secondary | ICD-10-CM

## 2021-04-07 DIAGNOSIS — Z87891 Personal history of nicotine dependence: Secondary | ICD-10-CM

## 2021-04-08 LAB — URINALYSIS, ROUTINE W REFLEX MICROSCOPIC
Bilirubin, UA: NEGATIVE
Glucose, UA: NEGATIVE
Leukocytes,UA: NEGATIVE
Nitrite, UA: NEGATIVE
Protein,UA: NEGATIVE
RBC, UA: NEGATIVE
Specific Gravity, UA: >1.03 — ABNORMAL HIGH (ref 1.005–1.030)
Urobilinogen, Ur: 0.2 mg/dL (ref 0.2–1.0)
pH, UA: 5.5 (ref 5.0–7.5)

## 2021-04-08 LAB — COMPREHENSIVE METABOLIC PANEL
ALT: 19 IU/L (ref 0–44)
AST: 19 IU/L (ref 0–40)
Albumin/Globulin Ratio: 2.3 — ABNORMAL HIGH (ref 1.2–2.2)
Albumin: 4.5 g/dL (ref 3.8–4.9)
Alkaline Phosphatase: 81 IU/L (ref 44–121)
BUN/Creatinine Ratio: 13 (ref 9–20)
BUN: 17 mg/dL (ref 6–24)
Bilirubin Total: 0.5 mg/dL (ref 0.0–1.2)
CO2: 27 mmol/L (ref 20–29)
Calcium: 9.5 mg/dL (ref 8.7–10.2)
Chloride: 106 mmol/L (ref 96–106)
Creatinine, Ser: 1.28 mg/dL — ABNORMAL HIGH (ref 0.76–1.27)
Globulin, Total: 2 g/dL (ref 1.5–4.5)
Glucose: 97 mg/dL (ref 70–99)
Potassium: 4.9 mmol/L (ref 3.5–5.2)
Sodium: 145 mmol/L — ABNORMAL HIGH (ref 134–144)
Total Protein: 6.5 g/dL (ref 6.0–8.5)
eGFR: 66 mL/min/{1.73_m2} (ref >59)

## 2021-04-14 ENCOUNTER — Encounter: Payer: Self-pay | Admitting: Internal Medicine

## 2021-04-14 ENCOUNTER — Other Ambulatory Visit: Payer: Self-pay

## 2021-04-14 ENCOUNTER — Ambulatory Visit: Payer: BC Managed Care – PPO | Admitting: Internal Medicine

## 2021-04-14 VITALS — BP 136/87 | HR 67 | Temp 98.3°F | Ht 71.65 in | Wt 214.4 lb

## 2021-04-14 DIAGNOSIS — G8929 Other chronic pain: Secondary | ICD-10-CM

## 2021-04-14 DIAGNOSIS — E781 Pure hyperglyceridemia: Secondary | ICD-10-CM

## 2021-04-14 DIAGNOSIS — M5442 Lumbago with sciatica, left side: Secondary | ICD-10-CM

## 2021-04-14 DIAGNOSIS — I700 Atherosclerosis of aorta: Secondary | ICD-10-CM | POA: Diagnosis not present

## 2021-04-14 DIAGNOSIS — I10 Essential (primary) hypertension: Secondary | ICD-10-CM

## 2021-04-14 DIAGNOSIS — M5441 Lumbago with sciatica, right side: Secondary | ICD-10-CM

## 2021-04-14 MED ORDER — ROSUVASTATIN CALCIUM 10 MG PO TABS: 10.0000 mg | ORAL_TABLET | Freq: Every day | ORAL | 3 refills | Status: DC

## 2021-04-14 NOTE — Progress Notes (Signed)
BP 136/87    Pulse 67    Temp 98.3 F (36.8 C) (Oral)    Ht 5' 11.65" (1.82 m)    Wt 214 lb 6.4 oz (97.3 kg)    SpO2 100%    BMI 29.36 kg/m    Subjective:    Patient ID: Ethan Snyder, male    DOB: September 29, 1965, 56 y.o.   MRN: [id number]  Chief Complaint  Patient presents with   Hypertension   Hyperlipidemia    HPI: Ethan Snyder is a 56 y.o. male  Sister has liver cancer and is one year older than her. Doenst have any details LFTS wnl today  Hypertension This is a chronic (is on losartan and amlodipine for such) problem. The current episode started more than 1 month ago. The problem is controlled. Pertinent negatives include no anxiety, blurred vision, chest pain, headaches, malaise/fatigue, neck pain, orthopnea, palpitations, peripheral edema, PND, shortness of breath or sweats.  Hyperlipidemia This is a chronic problem. The problem is controlled. Pertinent negatives include no chest pain or shortness of breath.   Chief Complaint  Patient presents with   Hypertension   Hyperlipidemia    Relevant past medical, surgical, family and social history reviewed and updated as indicated. Interim medical history since our last visit reviewed. Allergies and medications reviewed and updated.  Review of Systems  Constitutional:  Negative for malaise/fatigue.  Eyes:  Negative for blurred vision.  Respiratory:  Negative for shortness of breath.   Cardiovascular:  Negative for chest pain, palpitations, orthopnea and PND.  Musculoskeletal:  Negative for neck pain.  Neurological:  Negative for headaches.   Per HPI unless specifically indicated above     Objective:    BP 136/87    Pulse 67    Temp 98.3 F (36.8 C) (Oral)    Ht 5' 11.65" (1.82 m)    Wt 214 lb 6.4 oz (97.3 kg)    SpO2 100%    BMI 29.36 kg/m   Wt Readings from Last 3 Encounters:  04/14/21 214 lb 6.4 oz (97.3 kg)  03/30/21 210 lb (95.3 kg)  01/12/21 211 lb 12.8 oz (96.1 kg)    Physical  Exam Vitals and nursing note reviewed.  Constitutional:      General: He is not in acute distress.    Appearance: Normal appearance. He is not ill-appearing or diaphoretic.  HENT:     Head: Normocephalic and atraumatic.     Right Ear: Tympanic membrane and external ear normal. There is no impacted cerumen.     Left Ear: External ear normal.     Nose: No congestion or rhinorrhea.     Mouth/Throat:     Pharynx: No oropharyngeal exudate or posterior oropharyngeal erythema.  Eyes:     Conjunctiva/sclera: Conjunctivae normal.     Pupils: Pupils are equal, round, and reactive to light.  Cardiovascular:     Rate and Rhythm: Normal rate and regular rhythm.     Heart sounds: No murmur heard.   No friction rub. No gallop.  Pulmonary:     Effort: No respiratory distress.     Breath sounds: No stridor. No wheezing or rhonchi.  Chest:     Chest wall: No tenderness.  Abdominal:     General: Abdomen is flat. Bowel sounds are normal.     Palpations: Abdomen is soft. There is no mass.     Tenderness: There is no abdominal tenderness.  Musculoskeletal:     Cervical back: Normal range of  motion and neck supple. No rigidity or tenderness.     Left lower leg: No edema.  Skin:    General: Skin is warm and dry.     Coloration: Skin is not jaundiced.  Neurological:     General: No focal deficit present.     Mental Status: He is alert. Mental status is at baseline. He is disoriented.  Psychiatric:        Mood and Affect: Mood normal.        Behavior: Behavior normal.        Thought Content: Thought content normal.        Judgment: Judgment normal.    Results for orders placed or performed in visit on 04/07/21  Urinalysis, Routine w reflex microscopic  Result Value Ref Range   Specific Gravity, UA >1.030 (H) 1.005 - 1.030   pH, UA 5.5 5.0 - 7.5   Color, UA Yellow Yellow   Appearance Ur Cloudy (A) Clear   Leukocytes,UA Negative Negative   Protein,UA Negative Negative/Trace   Glucose, UA  Negative Negative   Ketones, UA Trace (A) Negative   RBC, UA Negative Negative   Bilirubin, UA Negative Negative   Urobilinogen, Ur 0.2 0.2 - 1.0 mg/dL   Nitrite, UA Negative Negative  Comprehensive metabolic panel  Result Value Ref Range   Glucose 97 70 - 99 mg/dL   BUN 17 6 - 24 mg/dL   Creatinine, Ser 1.28 (H) 0.76 - 1.27 mg/dL   eGFR 66 >59 mL/min/1.73   BUN/Creatinine Ratio 13 9 - 20   Sodium 145 (H) 134 - 144 mmol/L   Potassium 4.9 3.5 - 5.2 mmol/L   Chloride 106 96 - 106 mmol/L   CO2 27 20 - 29 mmol/L   Calcium 9.5 8.7 - 10.2 mg/dL   Total Protein 6.5 6.0 - 8.5 g/dL   Albumin 4.5 3.8 - 4.9 g/dL   Globulin, Total 2.0 1.5 - 4.5 g/dL   Albumin/Globulin Ratio 2.3 (H) 1.2 - 2.2   Bilirubin Total 0.5 0.0 - 1.2 mg/dL   Alkaline Phosphatase 81 44 - 121 IU/L   AST 19 0 - 40 IU/L   ALT 19 0 - 44 IU/L        Current Outpatient Medications:    amLODipine (NORVASC) 10 MG tablet, Take 10 mg by mouth daily., Disp: , Rfl:    loratadine (CLARITIN) 10 MG tablet, Take 1 tablet by mouth daily., Disp: , Rfl:    losartan (COZAAR) 50 MG tablet, Take 1 tablet (50 mg total) by mouth daily., Disp: 90 tablet, Rfl: 3   omeprazole (PRILOSEC OTC) 20 MG tablet, Take 1 tablet by mouth daily., Disp: , Rfl:    gabapentin (NEURONTIN) 300 MG capsule, Take 300 mg by mouth 3 (three) times daily. (Patient not taking: Reported on 04/14/2021), Disp: , Rfl:    nicotine (NICODERM CQ) 21 mg/24hr patch, Place 1 patch (21 mg total) onto the skin daily. (Patient not taking: Reported on 04/14/2021), Disp: 28 patch, Rfl: 0    Assessment & Plan:  Aortic atherosclerosis :  Aortic atherosclerosis found on scan dated 12/21 Patient will be started on a  statin for such. Consider further referral to cardiology for work-up including stress testing.   HTN continue current meds as listed in HPI Continue current meds.  Medication compliance emphasised. pt advised to keep Bp logs. Pt verbalised understanding of the  same. Pt to have a low salt diet . Exercise to reach a goal of at least 150  mins a week.  lifestyle modifications explained and pt understands importance of the above.  3. GERD is on omeprazole for such   4. Back pain is getting PT for such Chronic stable.  To have an MRI  Mild DJD of L spine seeing [facility] for such they want to schedule an MRI and have epidurals.  Stopped Neurontin. 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  Problem List Items Addressed This Visit   None    No orders of the defined types were placed in this encounter.    No orders of the defined types were placed in this encounter.    Follow up plan: No follow-ups on file.

## 2021-05-11 ENCOUNTER — Telehealth (INDEPENDENT_AMBULATORY_CARE_PROVIDER_SITE_OTHER): Payer: BC Managed Care – PPO | Admitting: Internal Medicine

## 2021-05-11 ENCOUNTER — Other Ambulatory Visit: Payer: Self-pay | Admitting: Internal Medicine

## 2021-05-11 ENCOUNTER — Encounter: Payer: Self-pay | Admitting: Internal Medicine

## 2021-05-11 DIAGNOSIS — R0602 Shortness of breath: Secondary | ICD-10-CM | POA: Diagnosis not present

## 2021-05-11 DIAGNOSIS — J069 Acute upper respiratory infection, unspecified: Secondary | ICD-10-CM

## 2021-05-11 MED ORDER — ALBUTEROL SULFATE HFA 108 (90 BASE) MCG/ACT IN AERS: 2.0000 | INHALATION_SPRAY | Freq: Four times a day (QID) | RESPIRATORY_TRACT | 0 refills | Status: DC | PRN

## 2021-05-11 MED ORDER — ROSUVASTATIN CALCIUM 10 MG PO TABS: 10.0000 mg | ORAL_TABLET | Freq: Every day | ORAL | 3 refills | Status: DC

## 2021-05-11 MED ORDER — FLUTICASONE PROPIONATE 50 MCG/ACT NA SUSP: 2.0000 | Freq: Every day | NASAL | 6 refills | Status: DC

## 2021-05-11 MED ORDER — FEXOFENADINE HCL 180 MG PO TABS: 180.0000 mg | ORAL_TABLET | Freq: Every day | ORAL | 1 refills | Status: DC

## 2021-05-11 NOTE — Telephone Encounter (Signed)
Pharmacy didn't receive rx   Requested Prescriptions  Pending Prescriptions Disp Refills   rosuvastatin (CRESTOR) 10 MG tablet 90 tablet 3    Sig: Take 1 tablet (10 mg total) by mouth daily.     Cardiovascular:  Antilipid - Statins 2 Failed - 05/11/2021 11:15 AM      Failed - Cr in normal range and within 360 days    Creatinine, Ser  Date Value Ref Range Status  04/07/2021 1.28 (H) 0.76 - 1.27 mg/dL Final         Failed - Lipid Panel in normal range within the last 12 months    Cholesterol, Total  Date Value Ref Range Status  12/28/2020 190 100 - 199 mg/dL Final   LDL Chol Calc (NIH)  Date Value Ref Range Status  12/28/2020 127 (H) 0 - 99 mg/dL Final   HDL  Date Value Ref Range Status  12/28/2020 37 (L) >39 mg/dL Final   Triglycerides  Date Value Ref Range Status  12/28/2020 145 0 - 149 mg/dL Final         Passed - Patient is not pregnant      Passed - Valid encounter within last 12 months    Recent Outpatient Visits          Today Viral upper respiratory tract infection   [facility] Vigg, Avanti, MD   3 weeks ago Aortic atherosclerosis ([place])   [facility] Vigg, Avanti, MD   3 months ago Primary hypertension   [place] Vigg, Avanti, MD   4 months ago Chronic bilateral low back pain with bilateral sciatica   [place] Vigg, Avanti, MD      Future Appointments            In 5 months Vigg, Avanti, MD [facility], [facility]

## 2021-05-11 NOTE — Telephone Encounter (Signed)
Duplicate request Requested Prescriptions  Pending Prescriptions Disp Refills   albuterol (VENTOLIN HFA) 108 (90 Base) MCG/ACT inhaler [Pharmacy Med Name: ALBUTEROL HFA INH (200 PUFFS) 6.7GM] 20.1 g     Sig: INHALE 2 PUFFS INTO THE LUNGS EVERY 6 HOURS AS NEEDED FOR WHEEZING OR SHORTNESS OF BREATH     Pulmonology:  [facility] 2 Passed - 05/11/2021 11:32 AM      Passed - Last BP in normal range    BP Readings from Last 1 Encounters:  04/14/21 136/87         Passed - Last Heart Rate in normal range    Pulse Readings from Last 1 Encounters:  04/14/21 67         Passed - Valid encounter within last 12 months    Recent Outpatient Visits          Today Viral upper respiratory tract infection   [facility] Vigg, Avanti, MD   3 weeks ago Aortic atherosclerosis ([place])   [facility] Vigg, Avanti, MD   3 months ago Primary hypertension   [place] Vigg, Avanti, MD   4 months ago Chronic bilateral low back pain with bilateral sciatica   [place], MD      Future Appointments            In 5 months Vigg, Avanti, MD [facility], [facility]

## 2021-05-11 NOTE — Telephone Encounter (Signed)
[facility] [facility] called about the refill(s) Rosuvastatin requested., it was sent on 04/14/21 #90/3 refill(s), extended hold times, unable to speak to anyone. Patient called and he says that the pharmacy called him on 04/14/21 and said the medication was on backorder and would call him in a few days. He said he never heard back so he went up there last week and they told him there was no prescription on file. I advised it will be resent.

## 2021-05-11 NOTE — Progress Notes (Signed)
There were no vitals taken for this visit.   Subjective:    Patient ID: Ethan Snyder, male    DOB: Feb 15, 1966, 56 y.o.   MRN: [id number]  Chief Complaint  Patient presents with   Sore Throat    Started about a week ago, symptoms Cough, chest congestion, and hoarse.     HPI: Ethan Snyder is a 56 y.o. male  Cough This is a new problem. The current episode started in the past 7 days. The problem has been waxing and waning. The cough is Non-productive. Associated symptoms include nasal congestion, postnasal drip and a sore throat. Pertinent negatives include no chest pain, chills, ear congestion, ear pain, fever, headaches, heartburn, hemoptysis, myalgias, rhinorrhea, shortness of breath, sweats, weight loss or wheezing.   Chief Complaint  Patient presents with   Sore Throat    Started about a week ago, symptoms Cough, chest congestion, and hoarse.     Relevant past medical, surgical, family and social history reviewed and updated as indicated. Interim medical history since our last visit reviewed. Allergies and medications reviewed and updated.  Review of Systems  Constitutional:  Negative for chills, fever and weight loss.  HENT:  Positive for postnasal drip and sore throat. Negative for ear pain and rhinorrhea.   Respiratory:  Positive for cough. Negative for hemoptysis, shortness of breath and wheezing.   Cardiovascular:  Negative for chest pain.  Gastrointestinal:  Negative for heartburn.  Musculoskeletal:  Negative for myalgias.  Neurological:  Negative for headaches.   Per HPI unless specifically indicated above     Objective:    There were no vitals taken for this visit.  Wt Readings from Last 3 Encounters:  04/14/21 214 lb 6.4 oz (97.3 kg)  03/30/21 210 lb (95.3 kg)  01/12/21 211 lb 12.8 oz (96.1 kg)    Physical Exam  Unable to peform sec to virtual visit.   Results for orders placed or performed in visit on 04/07/21  Urinalysis,  Routine w reflex microscopic  Result Value Ref Range   Specific Gravity, UA >1.030 (H) 1.005 - 1.030   pH, UA 5.5 5.0 - 7.5   Color, UA Yellow Yellow   Appearance Ur Cloudy (A) Clear   Leukocytes,UA Negative Negative   Protein,UA Negative Negative/Trace   Glucose, UA Negative Negative   Ketones, UA Trace (A) Negative   RBC, UA Negative Negative   Bilirubin, UA Negative Negative   Urobilinogen, Ur 0.2 0.2 - 1.0 mg/dL   Nitrite, UA Negative Negative  Comprehensive metabolic panel  Result Value Ref Range   Glucose 97 70 - 99 mg/dL   BUN 17 6 - 24 mg/dL   Creatinine, Ser 9.85 (H) 0.76 - 1.27 mg/dL   eGFR 66 >39 IL/KVV/8.85   BUN/Creatinine Ratio 13 9 - 20   Sodium 145 (H) 134 - 144 mmol/L   Potassium 4.9 3.5 - 5.2 mmol/L   Chloride 106 96 - 106 mmol/L   CO2 27 20 - 29 mmol/L   Calcium 9.5 8.7 - 10.2 mg/dL   Total Protein 6.5 6.0 - 8.5 g/dL   Albumin 4.5 3.8 - 4.9 g/dL   Globulin, Total 2.0 1.5 - 4.5 g/dL   Albumin/Globulin Ratio 2.3 (H) 1.2 - 2.2   Bilirubin Total 0.5 0.0 - 1.2 mg/dL   Alkaline Phosphatase 81 44 - 121 IU/L   AST 19 0 - 40 IU/L   ALT 19 0 - 44 IU/L        Current Outpatient  Medications:    albuterol (VENTOLIN HFA) 108 (90 Base) MCG/ACT inhaler, Inhale 2 puffs into the lungs every 6 (six) hours as needed for wheezing or shortness of breath., Disp: 8 g, Rfl: 0   amLODipine (NORVASC) 10 MG tablet, Take 10 mg by mouth daily., Disp: , Rfl:    fexofenadine (ALLEGRA ALLERGY) 180 MG tablet, Take 1 tablet (180 mg total) by mouth daily., Disp: 10 tablet, Rfl: 1   fluticasone (FLONASE) 50 MCG/ACT nasal spray, Place 2 sprays into both nostrils daily., Disp: 16 g, Rfl: 6   loratadine (CLARITIN) 10 MG tablet, Take 1 tablet by mouth daily., Disp: , Rfl:    losartan (COZAAR) 50 MG tablet, Take 1 tablet (50 mg total) by mouth daily., Disp: 90 tablet, Rfl: 3   omeprazole (PRILOSEC OTC) 20 MG tablet, Take 1 tablet by mouth daily., Disp: , Rfl:    rosuvastatin (CRESTOR) 10 MG  tablet, Take 1 tablet (10 mg total) by mouth daily., Disp: 90 tablet, Rfl: 3    Assessment & Plan:  URI  Will start pt on allegra/ flonase for such  Use albuterol prrn for sob pt is a current smoker Consider steroids/ Zpak if doenst feel better , no fever. No purulent d/c doubt ac exacerbation of COPD and doubt PNA>  Howerer pt advised to clal if feels worse Will check a home COVID test Will let us know if positive Declines in office swabbing today agrees to come in if gets worse. pt advised to take Tylenol q 4- 6 hourly as needed. pt to take allegra q pm as needed and to call office if symptoms worsened pt verbalised understanding of such.     Problem List Items Addressed This Visit   None    No orders of the defined types were placed in this encounter.    Meds ordered this encounter  Medications   fluticasone (FLONASE) 50 MCG/ACT nasal spray    Sig: Place 2 sprays into both nostrils daily.    Dispense:  16 g    Refill:  6   fexofenadine (ALLEGRA ALLERGY) 180 MG tablet    Sig: Take 1 tablet (180 mg total) by mouth daily.    Dispense:  10 tablet    Refill:  1   albuterol (VENTOLIN HFA) 108 (90 Base) MCG/ACT inhaler    Sig: Inhale 2 puffs into the lungs every 6 (six) hours as needed for wheezing or shortness of breath.    Dispense:  8 g    Refill:  0     Follow up plan: No follow-ups on file.   This visit was completed via video visit through MyChart due to the restrictions of the COVID-19 pandemic. All issues as above were discussed and addressed. Physical exam was done as above through visual confirmation on video through MyChart. If it was felt that the patient should be evaluated in the office, they were directed there. The patient verbally consented to this visit. Location of the patient: home Location of the provider: work Those involved with this call:  Provider: Charlynne Cousins, MD CMA: Frazier Butt, [place] Desk/Registration: Myrlene Broker  Time spent on call:   10 minutes with patient face to face via video conference. More than 50% of this time was spent in counseling and coordination of care. 10 minutes total spent in review of patient's record and preparation of their chart.

## 2021-05-11 NOTE — Telephone Encounter (Signed)
Medication Refill - Medication:rosuvastatin (CRESTOR) 10 MG tablet  Has the patient contacted their pharmacy? yes (Agent: If no, request that the patient contact the pharmacy for the refill. If patient does not wish to contact the pharmacy document the reason why and proceed with request.) (Agent: If yes, when and what did the pharmacy advise?)contact pcp  Preferred Pharmacy (with phone number or street name): [id number] - [place], [address] AT [place] Phone:  [phone]  Fax:  [phone]     Has the patient been seen for an appointment in the last year OR does the patient have an upcoming appointment? yes  Agent: Please be advised that RX refills may take up to 3 business days. We ask that you follow-up with your pharmacy.

## 2021-06-02 ENCOUNTER — Other Ambulatory Visit: Payer: Self-pay | Admitting: Internal Medicine

## 2021-06-02 NOTE — Telephone Encounter (Signed)
Requested medication (s) are due for refill today:   Yes  Requested medication (s) are on the active medication list:   Yes  Future visit scheduled:   Yes   Last ordered: 05/11/2021 8 g, 0 refills  Returned because a 90 day supply is being requested.   Requested Prescriptions  Pending Prescriptions Disp Refills   VENTOLIN HFA 108 (90 Base) MCG/ACT inhaler [Pharmacy Med Name: VENTOLIN HFA INH W/DOS CTR 200PUFFS] 54 g     Sig: INHALE 2 PUFFS INTO THE LUNGS EVERY 6 HOURS AS NEEDED FOR WHEEZING OR SHORTNESS OF BREATH     Pulmonology:  Beta Agonists 2 Passed - 06/02/2021  3:38 AM      Passed - Last BP in normal range    BP Readings from Last 1 Encounters:  04/14/21 136/87          Passed - Last Heart Rate in normal range    Pulse Readings from Last 1 Encounters:  04/14/21 67          Passed - Valid encounter within last 12 months    Recent Outpatient Visits           3 weeks ago Viral upper respiratory tract infection   [facility] Vigg, Avanti, MD   1 month ago Aortic atherosclerosis ([place])   [facility] Vigg, Avanti, MD   4 months ago Primary hypertension   [place] Vigg, Avanti, MD   5 months ago Chronic bilateral low back pain with bilateral sciatica   [place], [place]       Future Appointments             In 4 months Vigg, Avanti, MD [facility]

## 2021-10-12 ENCOUNTER — Encounter: Payer: Self-pay | Admitting: Unknown Physician Specialty

## 2021-10-12 ENCOUNTER — Ambulatory Visit: Payer: BC Managed Care – PPO | Admitting: Unknown Physician Specialty

## 2021-10-12 VITALS — BP 129/85 | HR 70 | Temp 98.7°F | Ht 71.65 in | Wt 204.2 lb

## 2021-10-12 DIAGNOSIS — I10 Essential (primary) hypertension: Secondary | ICD-10-CM | POA: Diagnosis not present

## 2021-10-12 DIAGNOSIS — Z0000 Encounter for general adult medical examination without abnormal findings: Secondary | ICD-10-CM

## 2021-10-12 DIAGNOSIS — N200 Calculus of kidney: Secondary | ICD-10-CM

## 2021-10-12 DIAGNOSIS — Z125 Encounter for screening for malignant neoplasm of prostate: Secondary | ICD-10-CM

## 2021-10-12 DIAGNOSIS — K219 Gastro-esophageal reflux disease without esophagitis: Secondary | ICD-10-CM

## 2021-10-12 DIAGNOSIS — Z1211 Encounter for screening for malignant neoplasm of colon: Secondary | ICD-10-CM

## 2021-10-12 DIAGNOSIS — E785 Hyperlipidemia, unspecified: Secondary | ICD-10-CM | POA: Insufficient documentation

## 2021-10-12 DIAGNOSIS — E781 Pure hyperglyceridemia: Secondary | ICD-10-CM

## 2021-10-12 DIAGNOSIS — G5601 Carpal tunnel syndrome, right upper limb: Secondary | ICD-10-CM

## 2021-10-12 DIAGNOSIS — I700 Atherosclerosis of aorta: Secondary | ICD-10-CM

## 2021-10-12 DIAGNOSIS — E7849 Other hyperlipidemia: Secondary | ICD-10-CM

## 2021-10-12 MED ORDER — AMLODIPINE BESYLATE 10 MG PO TABS: 10.0000 mg | ORAL_TABLET | Freq: Every day | ORAL | 3 refills | Status: DC

## 2021-10-12 MED ORDER — LOSARTAN POTASSIUM 50 MG PO TABS
50.0000 mg | ORAL_TABLET | Freq: Every day | ORAL | 3 refills | Status: DC
Start: 2021-10-12 — End: 2022-10-13

## 2021-10-12 NOTE — Assessment & Plan Note (Signed)
On crestor.  Check lipid panel

## 2021-10-12 NOTE — Progress Notes (Signed)
BP 129/85   Pulse 70   Temp 98.7 F (37.1 C) (Oral)   Ht 5' 11.65" (1.82 m)   Wt 204 lb 3.2 oz (92.6 kg)   SpO2 99%   BMI 27.96 kg/m    Subjective:    Patient ID: Ethan Snyder, male    DOB: 1966/01/31, 56 y.o.   MRN: [id number]  HPI: Rachid Parham is a 56 y.o. male  Chief Complaint  Patient presents with   Hypertension   Hyperlipidemia   Nephrolithiasis   Gastroesophageal Reflux   Carpal Tunnel    Right hand for past few years getting back now    Hypertension Using medications without difficulty Average home BPs.  Not checking as it is inaccurate.  States he can tell when his BP is up No problems or lightheadedness No chest pain with exertion or shortness of breath No Edema   Hyperlipidemia Taking Crestor 10 mg No Muscle aches  Diet compliance:Exercise: Not exercising.  States his diet is good.  Tries to cut out fat and meat.   GERD Takes Omeprazole daily.  He tried to stop in the past and burning in the back of his throat.     Nephrolithesis History of this and concerned as it was on his last lung CT.  Doesn't want to end up in the ER again.  Wants this followed.    Relevant past medical, surgical, family and social history reviewed and updated as indicated. Interim medical history since our last visit reviewed. Allergies and medications reviewed and updated.  Review of Systems  Per HPI unless specifically indicated above     Objective:    BP 129/85   Pulse 70   Temp 98.7 F (37.1 C) (Oral)   Ht 5' 11.65" (1.82 m)   Wt 204 lb 3.2 oz (92.6 kg)   SpO2 99%   BMI 27.96 kg/m   Wt Readings from Last 3 Encounters:  10/12/21 204 lb 3.2 oz (92.6 kg)  04/14/21 214 lb 6.4 oz (97.3 kg)  03/30/21 210 lb (95.3 kg)    Physical Exam Constitutional:      General: He is not in acute distress.    Appearance: Normal appearance. He is well-developed.  HENT:     Head: Normocephalic and atraumatic.  Eyes:     General: Lids are normal. No  scleral icterus.       Right eye: No discharge.        Left eye: No discharge.     Conjunctiva/sclera: Conjunctivae normal.  Neck:     Vascular: No carotid bruit or JVD.  Cardiovascular:     Rate and Rhythm: Normal rate and regular rhythm.     Heart sounds: Normal heart sounds.  Pulmonary:     Effort: Pulmonary effort is normal. No respiratory distress.     Breath sounds: Normal breath sounds.  Abdominal:     Palpations: There is no hepatomegaly or splenomegaly.  Musculoskeletal:        General: Normal range of motion.     Cervical back: Normal range of motion and neck supple.  Skin:    General: Skin is warm and dry.     Coloration: Skin is not pale.     Findings: No rash.  Neurological:     Mental Status: He is alert and oriented to person, place, and time.  Psychiatric:        Behavior: Behavior normal.        Thought Content: Thought content normal.  Judgment: Judgment normal.     Results for orders placed or performed in visit on 04/07/21  Urinalysis, Routine w reflex microscopic  Result Value Ref Range   Specific Gravity, UA >1.030 (H) 1.005 - 1.030   pH, UA 5.5 5.0 - 7.5   Color, UA Yellow Yellow   Appearance Ur Cloudy (A) Clear   Leukocytes,UA Negative Negative   Protein,UA Negative Negative/Trace   Glucose, UA Negative Negative   Ketones, UA Trace (A) Negative   RBC, UA Negative Negative   Bilirubin, UA Negative Negative   Urobilinogen, Ur 0.2 0.2 - 1.0 mg/dL   Nitrite, UA Negative Negative  Comprehensive metabolic panel  Result Value Ref Range   Glucose 97 70 - 99 mg/dL   BUN 17 6 - 24 mg/dL   Creatinine, Ser 0.20 (H) 0.76 - 1.27 mg/dL   eGFR 66 >48 TW/HKX/0.97   BUN/Creatinine Ratio 13 9 - 20   Sodium 145 (H) 134 - 144 mmol/L   Potassium 4.9 3.5 - 5.2 mmol/L   Chloride 106 96 - 106 mmol/L   CO2 27 20 - 29 mmol/L   Calcium 9.5 8.7 - 10.2 mg/dL   Total Protein 6.5 6.0 - 8.5 g/dL   Albumin 4.5 3.8 - 4.9 g/dL   Globulin, Total 2.0 1.5 - 4.5 g/dL    Albumin/Globulin Ratio 2.3 (H) 1.2 - 2.2   Bilirubin Total 0.5 0.0 - 1.2 mg/dL   Alkaline Phosphatase 81 44 - 121 IU/L   AST 19 0 - 40 IU/L   ALT 19 0 - 44 IU/L      Assessment & Plan:   Problem List Items Addressed This Visit       Unprioritized   Aortic atherosclerosis (HCC)    On CT scan.  Recommended quitting smoking and will assure lipids at goal      Relevant Medications   amLODipine (NORVASC) 10 MG tablet   losartan (COZAAR) 50 MG tablet   Carpal tunnel syndrome on right    Difficulty doing job and wearing wrist splint at night.  Difficulty sleeping      GERD (gastroesophageal reflux disease)    Daily Omeprazole use.  Has tried diet changes and alternative meds and nothing else has worked.  Not interested in weaning at this time      Hyperlipidemia    On crestor.  Check lipid panel      Relevant Medications   amLODipine (NORVASC) 10 MG tablet   losartan (COZAAR) 50 MG tablet   Nephrolithiasis    Concerned as one showed up on last CT.  Will refer to Urology for further evaluatin      Relevant Orders   Ambulatory referral to Urology   Primary hypertension - Primary    Stable, continue present medications.        Relevant Medications   amLODipine (NORVASC) 10 MG tablet   losartan (COZAAR) 50 MG tablet   Other Relevant Orders   Comprehensive metabolic panel   Lipid Panel w/o Chol/HDL Ratio   Pure hypertriglyceridemia    I don't see a  Previous lipid panel.  Recheck today      Relevant Medications   amLODipine (NORVASC) 10 MG tablet   losartan (COZAAR) 50 MG tablet   Other Visit Diagnoses     Prostate cancer screening       Relevant Orders   PSA   Screen for colon cancer       Will schedule for colonoscopy   Relevant Orders  Ambulatory referral to Gastroenterology   Healthcare maintenance       Not interested in Shingles vaccine. Discussed Covid vaccination booster and not interested at ths time   Relevant Orders   TSH   Routine general  medical examination at a health care facility            Follow up plan: Return in about 6 months (around 04/14/2022).

## 2021-10-12 NOTE — Assessment & Plan Note (Signed)
I don't see a  Previous lipid panel.  Recheck today

## 2021-10-12 NOTE — Assessment & Plan Note (Signed)
Daily Omeprazole use.  Has tried diet changes and alternative meds and nothing else has worked.  Not interested in weaning at this time

## 2021-10-12 NOTE — Assessment & Plan Note (Signed)
Stable, continue present medications.   

## 2021-10-12 NOTE — Assessment & Plan Note (Signed)
Difficulty doing job and wearing wrist splint at night.  Difficulty sleeping

## 2021-10-12 NOTE — Assessment & Plan Note (Signed)
Concerned as one showed up on last CT.  Will refer to Urology for further evaluatin

## 2021-10-12 NOTE — Assessment & Plan Note (Signed)
On CT scan.  Recommended quitting smoking and will assure lipids at goal

## 2021-10-13 ENCOUNTER — Telehealth: Payer: Self-pay

## 2021-10-13 ENCOUNTER — Other Ambulatory Visit: Payer: Self-pay

## 2021-10-13 DIAGNOSIS — Z1211 Encounter for screening for malignant neoplasm of colon: Secondary | ICD-10-CM

## 2021-10-13 LAB — COMPREHENSIVE METABOLIC PANEL
ALT: 24 IU/L (ref 0–44)
AST: 24 IU/L (ref 0–40)
Albumin/Globulin Ratio: 2.2 (ref 1.2–2.2)
Albumin: 4.6 g/dL (ref 3.8–4.9)
Alkaline Phosphatase: 79 IU/L (ref 44–121)
BUN/Creatinine Ratio: 11 (ref 9–20)
BUN: 12 mg/dL (ref 6–24)
Bilirubin Total: 0.2 mg/dL (ref 0.0–1.2)
CO2: 25 mmol/L (ref 20–29)
Calcium: 9.6 mg/dL (ref 8.7–10.2)
Chloride: 103 mmol/L (ref 96–106)
Creatinine, Ser: 1.14 mg/dL (ref 0.76–1.27)
Globulin, Total: 2.1 g/dL (ref 1.5–4.5)
Glucose: 104 mg/dL — ABNORMAL HIGH (ref 70–99)
Potassium: 4.6 mmol/L (ref 3.5–5.2)
Sodium: 141 mmol/L (ref 134–144)
Total Protein: 6.7 g/dL (ref 6.0–8.5)
eGFR: 75 mL/min/{1.73_m2} (ref >59)

## 2021-10-13 LAB — LIPID PANEL W/O CHOL/HDL RATIO
Cholesterol, Total: 137 mg/dL (ref 100–199)
HDL: 53 mg/dL (ref >39)
LDL Chol Calc (NIH): 72 mg/dL (ref 0–99)
Triglycerides: 54 mg/dL (ref 0–149)
VLDL Cholesterol Cal: 12 mg/dL (ref 5–40)

## 2021-10-13 LAB — PSA: Prostate Specific Ag, Serum: 1.3 ng/mL (ref 0.0–4.0)

## 2021-10-13 LAB — TSH: TSH: 2.05 u[IU]/mL (ref 0.450–4.500)

## 2021-10-13 MED ORDER — GOLYTELY 236 G PO SOLR: ORAL | 0 refills | Status: DC

## 2021-10-13 NOTE — Telephone Encounter (Signed)
Gastroenterology Pre-Procedure Review  Request Date: 12/22/21 Requesting Physician: Dr. Allen Norris  PATIENT REVIEW QUESTIONS: The patient responded to the following health history questions as indicated:    1. Are you having any GI issues? yes (Acid reflux, history of diverticulitis, avoids certain foods due to allergies and diverticulitis) 2. Do you have a personal history of Polyps?  Performed 10 years ago 1 tiny polyps 3. Do you have a family history of Colon Cancer or Polyps? no 4. Diabetes Mellitus? no 5. Joint replacements in the past 12 months?no 6. Major health problems in the past 3 months?no 7. Any artificial heart valves, MVP, or defibrillator?no    MEDICATIONS & ALLERGIES:    Patient reports the following regarding taking any anticoagulation/antiplatelet therapy:   Plavix, Coumadin, Eliquis, Xarelto, Lovenox, Pradaxa, Brilinta, or Effient? no Aspirin? no  Patient confirms/reports the following medications:  Current Outpatient Medications  Medication Sig Dispense Refill   albuterol (VENTOLIN HFA) 108 (90 Base) MCG/ACT inhaler Inhale 2 puffs into the lungs every 6 (six) hours as needed for wheezing or shortness of breath. (Patient not taking: Reported on 10/12/2021) 8 g 0   amLODipine (NORVASC) 10 MG tablet Take 1 tablet (10 mg total) by mouth daily. 90 tablet 3   fluticasone (FLONASE) 50 MCG/ACT nasal spray Place 2 sprays into both nostrils daily. 16 g 6   losartan (COZAAR) 50 MG tablet Take 1 tablet (50 mg total) by mouth daily. 90 tablet 3   omeprazole (PRILOSEC OTC) 20 MG tablet Take 1 tablet by mouth daily.     rosuvastatin (CRESTOR) 10 MG tablet Take 1 tablet (10 mg total) by mouth daily. 90 tablet 3   No current facility-administered medications for this visit.    Patient confirms/reports the following allergies:  Allergies  Allergen Reactions   Banana Itching, Swelling and Other (See Comments)    Feels tingling all over   Bee Venom Swelling   Lisinopril     Other  reaction(s): Cough   Ibuprofen     Other reaction(s): Other (See Comments) Other Reaction: GI UPSET    No orders of the defined types were placed in this encounter.   AUTHORIZATION INFORMATION Primary Insurance: 1D#: Group #:  Secondary Insurance: 1D#: Group #:  SCHEDULE INFORMATION: Date: 12/22/21 Time: Location: [place]

## 2021-10-19 DIAGNOSIS — M19031 Primary osteoarthritis, right wrist: Secondary | ICD-10-CM | POA: Diagnosis not present

## 2021-10-19 DIAGNOSIS — M25531 Pain in right wrist: Secondary | ICD-10-CM | POA: Diagnosis not present

## 2021-10-25 ENCOUNTER — Ambulatory Visit: Payer: BC Managed Care – PPO | Admitting: Urology

## 2021-10-25 ENCOUNTER — Other Ambulatory Visit: Payer: Self-pay | Admitting: *Deleted

## 2021-10-25 ENCOUNTER — Ambulatory Visit
Admission: RE | Admit: 2021-10-25 | Discharge: 2021-10-25 | Disposition: A | Discharge status: Home / Self Care | Payer: BC Managed Care – PPO | Attending: Urology | Admitting: Urology

## 2021-10-25 ENCOUNTER — Other Ambulatory Visit: Payer: Self-pay | Admitting: Urology

## 2021-10-25 ENCOUNTER — Ambulatory Visit
Admission: RE | Admit: 2021-10-25 | Discharge: 2021-10-25 | Disposition: A | Discharge status: Home / Self Care | Payer: BC Managed Care – PPO | Source: Ambulatory Visit | Attending: Urology | Admitting: Urology

## 2021-10-25 ENCOUNTER — Encounter: Payer: Self-pay | Admitting: Urology

## 2021-10-25 ENCOUNTER — Telehealth: Payer: Self-pay

## 2021-10-25 VITALS — BP 125/77 | HR 61 | Ht 71.5 in | Wt 205.0 lb

## 2021-10-25 DIAGNOSIS — N200 Calculus of kidney: Secondary | ICD-10-CM

## 2021-10-25 DIAGNOSIS — M47814 Spondylosis without myelopathy or radiculopathy, thoracic region: Secondary | ICD-10-CM | POA: Diagnosis not present

## 2021-10-25 DIAGNOSIS — M47816 Spondylosis without myelopathy or radiculopathy, lumbar region: Secondary | ICD-10-CM | POA: Diagnosis not present

## 2021-10-25 NOTE — Patient Instructions (Signed)
Lithotripsy  Lithotripsy is a treatment that can help break up kidney stones that are too large to pass on their own. This is a nonsurgical procedure that crushes a kidney stone with shock waves. These shock waves pass through your body and focus on the kidney stone. They cause the kidney stone to break up into smaller pieces while it is still in the urinary tract. The smaller pieces of stone can pass more easily out of your body in the urine. Tell a health care provider about: Any allergies you have. All medicines you are taking, including vitamins, herbs, eye drops, creams, and over-the-counter medicines. Any problems you or family members have had with anesthetic medicines. Any blood disorders you have. Any surgeries you have had. Any medical conditions you have. Whether you are pregnant or may be pregnant. What are the risks? Generally, this is a safe procedure. However, problems may occur, including: Infection. Bleeding from the kidney. Bruising of the kidney or skin. Scarring of the kidney, which can lead to: Increased blood pressure. Poor kidney function. Return (recurrence) of kidney stones. Damage to other structures or organs, such as the liver, colon, spleen, or pancreas. Blockage (obstruction) of the tube that carries urine from the kidney to the bladder (ureter). Failure of the kidney stone to break into pieces (fragments). What happens before the procedure? Staying hydrated Follow instructions from your health care provider about hydration, which may include: Up to 2 hours before the procedure - you may continue to drink clear liquids, such as water, clear fruit juice, black coffee, and plain tea. Eating and drinking restrictions Follow instructions from your health care provider about eating and drinking, which may include: 8 hours before the procedure - stop eating heavy meals or foods, such as meat, fried foods, or fatty foods. 6 hours before the procedure - stop eating  light meals or foods, such as toast or cereal. 6 hours before the procedure - stop drinking milk or drinks that contain milk. 2 hours before the procedure - stop drinking clear liquids. Medicines Ask your health care provider about: Changing or stopping your regular medicines. This is especially important if you are taking diabetes medicines or blood thinners. Taking medicines such as aspirin and ibuprofen. These medicines can thin your blood. Do not take these medicines unless your health care provider tells you to take them. Taking over-the-counter medicines, vitamins, herbs, and supplements. Tests You may have tests, such as: Blood tests. Urine tests. Imaging tests, such as a CT scan. General instructions Plan to have someone take you home from the hospital or clinic. If you will be going home right after the procedure, plan to have someone with you for 24 hours. Ask your health care provider what steps will be taken to help prevent infection. These may include washing skin with a germ-killing soap. What happens during the procedure?  An IV will be inserted into one of your veins. You will be given one or more of the following: A medicine to help you relax (sedative). A medicine to make you fall asleep (general anesthetic). A water-filled cushion may be placed behind your kidney or on your abdomen. In some cases, you may be placed in a tub of lukewarm water. Your body will be positioned in a way that makes it easy to target the kidney stone. An X-ray or ultrasound exam will be done to locate your stone. Shock waves will be aimed at the stone. If you are awake, you may feel a tapping sensation  as the shock waves pass through your body. A flexible tube with holes in it (stent) may be placed in the ureter. This will help keep urine flowing from the kidney if the fragments of the stone have been blocking the ureter. The procedure may vary among health care providers and hospitals. What  happens after the procedure? You may have an X-ray to see whether the procedure was able to break up the kidney stone and how much of the stone has passed. If large stone fragments remain after treatment, you may need to have a second procedure at a later time. Your blood pressure, heart rate, breathing rate, and blood oxygen level will be monitored until you leave the hospital or clinic. You may be given antibiotics or pain medicine as needed. If a stent was placed in your ureter during surgery, it may stay in place for a few weeks. You may need to strain your urine to collect pieces of the kidney stone for testing. You will need to drink plenty of water. If you were given a sedative during the procedure, it can affect you for several hours. Do not drive or operate machinery until your health care provider says that it is safe. Summary Lithotripsy is a treatment that can help break up kidney stones that are too large to pass on their own. Lithotripsy is a nonsurgical procedure that crushes a kidney stone with shock waves. Generally, this is a safe procedure. However, problems may occur, including damage to the kidney or other organs, infection, or obstruction of the tube that carries urine from the kidney to the bladder (ureter). You may have a stent placed in your ureter to help drain your urine. This stent may stay in place for a few weeks. After the procedure, you will need to drink plenty of water. You may be asked to strain your urine to collect pieces of the kidney stone for testing. This information is not intended to replace advice given to you by your health care provider. Make sure you discuss any questions you have with your health care provider. Document Revised: 02/21/2021 Document Reviewed: 11/29/2020 Elsevier Patient Education  [place] After This sheet gives you information about how to care for yourself after your procedure. Your health care provider  may also give you more specific instructions. If you have problems or questions, contact your health care provider. What can I expect after the procedure? After the procedure, it is common to have: Some blood in your urine. This should only last for a few days. Soreness in your back, sides, or upper abdomen for a few days. Blotches or bruises on the area where the shock wave entered the skin. Pain, discomfort, or nausea when pieces (fragments) of the kidney stone move through the tube that carries urine from the kidney to the bladder (ureter). Stone fragments may pass soon after the procedure, but they may continue to pass for up to 4-8 weeks. If you have severe pain or nausea, contact your health care provider. This may be caused by a large stone that was not broken up, and this may mean that you need more treatment. Some pain or discomfort during urination. Some pain or discomfort in the lower abdomen or (in men) at the base of the penis. Follow these instructions at home: Medicines Take over-the-counter and prescription medicines only as told by your health care provider. If you were prescribed an antibiotic medicine, take it as told by your health care  provider. Do not stop taking the antibiotic even if you start to feel better. Ask your health care provider if the medicine prescribed to you requires you to avoid driving or using machinery. Eating and drinking     Drink enough fluid to keep your urine pale yellow. This helps any remaining pieces of the stone to pass. It can also help prevent new stones from forming. Eat plenty of fresh fruits and vegetables. Follow instructions from your health care provider about eating or drinking restrictions. You may be instructed to: Reduce how much salt (sodium) you eat or drink. Check ingredients and nutrition facts on packaged foods and beverages to see how much sodium they contain. Reduce how much meat you eat. Eat the recommended amount of  calcium for your age and gender. Ask your health care provider how much calcium you should have. General instructions Get plenty of rest. Return to your normal activities as told by your health care provider. Ask your health care provider what activities are safe for you. Most people can resume normal activities 1-2 days after the procedure. If you were given a sedative during the procedure, it can affect you for several hours. Do not drive or operate machinery until your health care provider says that it is safe. Your health care provider may direct you to lie in a certain position (postural drainage) and tap firmly (percuss) over your kidney area to help stone fragments pass. Follow instructions as told by your health care provider. If directed, strain all urine through the strainer that was provided by your health care provider. Keep all fragments for your health care provider to see. Any stones that are found may be sent to a medical lab for examination. The stone may be as small as a grain of salt. Keep all follow-up visits as told by your health care provider. This is important. Contact a health care provider if: You have a fever or chills. You have nausea that is severe or does not go away. You have any of these urinary symptoms: Blood in your urine for longer than your health care provider told you to expect. Urine that smells bad or unusual. Feeling a strong urge to urinate after emptying your bladder. Pain or burning with urination that does not go away. Urinating more often than usual and this does not go away. You have a stent and it comes out. Get help right away if: You have severe pain in your back, sides, or upper abdomen. You have any of these urinary symptoms: Severe pain while urinating. More blood in your urine or having blood in your urine when you did not before. Passing blood clots in your urine. Passing only a small amount of urine or being unable to pass any urine at  all. You have severe nausea that leads to persistent vomiting. You faint. Summary After this procedure, it is common to have some pain, discomfort, or nausea when pieces (fragments) of the kidney stone move through the tube that carries urine from the kidney to the bladder (ureter). If this pain or nausea is severe, however, you should contact your health care provider. Return to your normal activities as told by your health care provider. Ask your health care provider what activities are safe for you. Drink enough fluid to keep your urine pale yellow. This helps any remaining pieces of the stone to pass, and it can help prevent new stones from forming. If directed, strain your urine and keep all fragments for your  health care provider to see. Fragments or stones may be as small as a grain of salt. Get help right away if you have severe pain in your back, sides, or upper abdomen, or if you have severe pain while urinating. This information is not intended to replace advice given to you by your health care provider. Make sure you discuss any questions you have with your health care provider. Document Revised: 02/21/2021 Document Reviewed: 11/29/2020 Elsevier Patient Education  [place].  Dietary Guidelines to Help Prevent Kidney Stones Kidney stones are deposits of minerals and salts that form inside your kidneys. Your risk of developing kidney stones may be greater depending on your diet, your lifestyle, the medicines you take, and whether you have certain medical conditions. Most people can lower their chances of developing kidney stones by following the instructions below. Your dietitian may give you more specific instructions depending on your overall health and the type of kidney stones you tend to develop. What are tips for following this plan? Reading food labels  Choose foods with "no salt added" or "low-salt" labels. Limit your salt (sodium) intake to less than 1,500 mg a  day. Choose foods with calcium for each meal and snack. Try to eat about 300 mg of calcium at each meal. Foods that contain 200-500 mg of calcium a serving include: 8 oz (237 mL) of milk, calcium-fortifiednon-dairy milk, and calcium-fortifiedfruit juice. Calcium-fortified means that calcium has been added to these drinks. 8 oz (237 mL) of kefir, yogurt, and soy yogurt. 4 oz (114 g) of tofu. 1 oz (28 g) of cheese. 1 cup (150 g) of dried figs. 1 cup (91 g) of cooked broccoli. One 3 oz (85 g) can of sardines or mackerel. Most people need 1,000-1,500 mg of calcium a day. Talk to your dietitian about how much calcium is recommended for you. Shopping Buy plenty of fresh fruits and vegetables. Most people do not need to avoid fruits and vegetables, even if these foods contain nutrients that may contribute to kidney stones. When shopping for convenience foods, choose: Whole pieces of fruit. Pre-made salads with dressing on the side. Low-fat fruit and yogurt smoothies. Avoid buying frozen meals or prepared deli foods. These can be high in sodium. Look for foods with live cultures, such as yogurt and kefir. Choose high-fiber grains, such as whole-wheat breads, oat bran, and wheat cereals. Cooking Do not add salt to food when cooking. Place a salt shaker on the table and allow each person to add his or her own salt to taste. Use vegetable protein, such as beans, textured vegetable protein (TVP), or tofu, instead of meat in pasta, casseroles, and soups. Meal planning Eat less salt, if told by your dietitian. To do this: Avoid eating processed or pre-made food. Avoid eating fast food. Eat less animal protein, including cheese, meat, poultry, or fish, if told by your dietitian. To do this: Limit the number of times you have meat, poultry, fish, or cheese each week. Eat a diet free of meat at least 2 days a week. Eat only one serving each day of meat, poultry, fish, or seafood. When you prepare animal  protein, cut pieces into small portion sizes. For most meat and fish, one serving is about the size of the palm of your hand. Eat at least five servings of fresh fruits and vegetables each day. To do this: Keep fruits and vegetables on hand for snacks. Eat one piece of fruit or a handful of berries with breakfast.  Have a salad and fruit at lunch. Have two kinds of vegetables at dinner. Limit foods that are high in a substance called oxalate. These include: Spinach (cooked), rhubarb, beets, sweet potatoes, and Swiss chard. Peanuts. Potato chips, french fries, and baked potatoes with skin on. Nuts and nut products. Chocolate. If you regularly take a diuretic medicine, make sure to eat at least 1 or 2 servings of fruits or vegetables that are high in potassium each day. These include: Avocado. Banana. Orange, prune, carrot, or tomato juice. Baked potato. Cabbage. Beans and split peas. Lifestyle  Drink enough fluid to keep your urine pale yellow. This is the most important thing you can do. Spread your fluid intake throughout the day. If you drink alcohol: Limit how much you use to: 0-1 drink a day for women who are not pregnant. 0-2 drinks a day for men. Be aware of how much alcohol is in your drink. In the [place] one drink equals one 12 oz bottle of beer (355 mL), one 5 oz glass of wine (148 mL), or one 1 oz glass of hard liquor (44 mL). Lose weight if told by your health care provider. Work with your dietitian to find an eating plan and weight loss strategies that work best for you. General information Talk to your health care provider and dietitian about taking daily supplements. You may be told the following depending on your health and the cause of your kidney stones: Not to take supplements with vitamin C. To take a calcium supplement. To take a daily probiotic supplement. To take other supplements such as magnesium, fish oil, or vitamin B6. Take over-the-counter and prescription  medicines only as told by your health care provider. These include supplements. What foods should I limit? Limit your intake of the following foods, or eat them as told by your dietitian. Vegetables Spinach. Rhubarb. Beets. Canned vegetables. Angie Fava. Olives. Baked potatoes with skin. Grains Wheat bran. Baked goods. Salted crackers. Cereals high in sugar. Meats and other proteins Nuts. Nut butters. Large portions of meat, poultry, or fish. Salted, precooked, or cured meats, such as sausages, meat loaves, and hot dogs. Dairy Cheese. Beverages Regular soft drinks. Regular vegetable juice. Seasonings and condiments Seasoning blends with salt. Salad dressings. Soy sauce. Ketchup. Barbecue sauce. Other foods Canned soups. Canned pasta sauce. Casseroles. Pizza. Lasagna. Frozen meals. Potato chips. [place] fries. The items listed above may not be a complete list of foods and beverages you should limit. Contact a dietitian for more information. What foods should I avoid? Talk to your dietitian about specific foods you should avoid based on the type of kidney stones you have and your overall health. Fruits Grapefruit. The item listed above may not be a complete list of foods and beverages you should avoid. Contact a dietitian for more information. Summary Kidney stones are deposits of minerals and salts that form inside your kidneys. You can lower your risk of kidney stones by making changes to your diet. The most important thing you can do is drink enough fluid. Drink enough fluid to keep your urine pale yellow. Talk to your dietitian about how much calcium you should have each day, and eat less salt and animal protein as told by your dietitian. This information is not intended to replace advice given to you by your health care provider. Make sure you discuss any questions you have with your health care provider. Document Revised: 12/06/2020 Document Reviewed: 12/06/2020 Elsevier Patient  Education  [place].

## 2021-10-25 NOTE — Progress Notes (Signed)
[facility] ORDER FORM  Expected date of procedure: Patient preference  Surgeon: MD on truck  Post op standing: 2-4wk follow up w/KUB prior  Anticoagulation/Aspirin/NSAID standing order: Hold all 72 hours prior  Anesthesia standing order: MAC  VTE standing: SCD's  Dx: Left Nephrolithiasis  Procedure: left Extracorporeal shock wave lithotripsy  CPT : 35456  Standing Order Set:   *NPO after mn, KUB  *NS 132m/hr, Keflex 5028mPO, Benadryl 254mO, Valium 97m68m, Zofran 4mg 20m   Medications if other than standing orders:   UA and culture prior

## 2021-10-25 NOTE — Telephone Encounter (Signed)
Tried calling. No answer, was able to leave detailed message. Will try again.

## 2021-10-25 NOTE — Progress Notes (Signed)
10/25/21 9:58 AM   Ethan Snyder 07/31/1965 [id number]  CC: Kidney stones  HPI: Healthy 56 year old male referred for evaluation of kidney stone seen on recent CT chest performed for screening.  He reports a prior history of 2 spontaneously passed kidney stones, both of which were extremely painful.  Denies any prior urologic surgeries.  He denies any flank pain or urinary symptoms at this time.  KUB today shows an 8 mm left lower pole stone and 2 mm left upper pole stone.  PSA normal at 1.3 on 10/12/2021, normal renal function with creatinine 1.14.   PMH: Past Medical History:  Diagnosis Date   GERD (gastroesophageal reflux disease)    Hypertension     Surgical History: Past Surgical History:  Procedure Laterality Date   HERNIA REPAIR      Family History: Family History  Problem Relation Age of Onset   Healthy Mother    Lung cancer Father    Heart disease Brother     Social History:  reports that he has been smoking cigarettes. He has a 40.00 pack-year smoking history. He has been exposed to tobacco smoke. He has never used smokeless tobacco. He reports current alcohol use of about 2.0 standard drinks of alcohol per week. He reports that he does not use drugs.  Physical Exam: BP 125/77   Pulse 61   Ht 5' 11.5" (1.816 m)   Wt 205 lb (93 kg)   BMI 28.19 kg/m    Constitutional:  Alert and oriented, No acute distress. Cardiovascular: No clubbing, cyanosis, or edema. Respiratory: Normal respiratory effort, no increased work of breathing. GI: Abdomen is soft, nontender, nondistended, no abdominal masses  Laboratory Data: Reviewed  Pertinent Imaging: I have personally viewed and interpreted the prior chest CT as well as the KUB today that shows a 2 mm left upper pole stone and 8 mm left lower pole stone.  Assessment & Plan:   56 year old male with 2 prior stone events passed spontaneously with 8 mm left lower pole stone, 2 mm left upper pole stone who is  interested in stone removal options.  We discussed various treatment options for urolithiasis including observation with or without medical expulsive therapy, shockwave lithotripsy (SWL), ureteroscopy and laser lithotripsy with stent placement, and percutaneous nephrolithotomy.  We discussed that management is based on stone size, location, density, patient co-morbidities, and patient preference.   Stones <30m in size have a >80% spontaneous passage rate. Data surrounding the use of tamsulosin for medical expulsive therapy is controversial, but meta analyses suggests it is most efficacious for distal stones between 5-171min size. Possible side effects include dizziness/lightheadedness, and retrograde ejaculation.  SWL has a lower stone free rate in a single procedure, but also a lower complication rate compared to ureteroscopy and avoids a stent and associated stent related symptoms. Possible complications include renal hematoma, steinstrasse, and need for additional treatment.  Ureteroscopy with laser lithotripsy and stent placement has a higher stone free rate than SWL in a single procedure, however increased complication rate including possible infection, ureteral injury, bleeding, and stent related morbidity. Common stent related symptoms include dysuria, urgency/frequency, and flank pain.  We discussed general stone prevention strategies including adequate hydration with goal of producing 2.5 L of urine daily, increasing citric acid intake, increasing calcium intake during high oxalate meals, minimizing animal protein, and decreasing salt intake. Information about dietary recommendations given today.   After an extensive discussion of the risks and benefits of the above treatment options, the  patient would like to proceed with left shockwave lithotripsy of 8 mm lower pole stone.   Nickolas Madrid, MD 10/25/2021  [facility] [address], [place],  [place] [address] [phone]

## 2021-12-15 ENCOUNTER — Encounter: Payer: Self-pay | Admitting: Gastroenterology

## 2021-12-21 ENCOUNTER — Encounter: Payer: Self-pay | Admitting: Internal Medicine

## 2021-12-22 ENCOUNTER — Ambulatory Visit: Payer: BC Managed Care – PPO | Admitting: Anesthesiology

## 2021-12-22 ENCOUNTER — Encounter: Payer: Self-pay | Admitting: Gastroenterology

## 2021-12-22 ENCOUNTER — Ambulatory Visit
Admission: RE | Admit: 2021-12-22 | Discharge: 2021-12-22 | Disposition: A | Discharge status: Home / Self Care | Payer: BC Managed Care – PPO | Attending: Gastroenterology | Admitting: Gastroenterology

## 2021-12-22 ENCOUNTER — Other Ambulatory Visit: Payer: Self-pay

## 2021-12-22 ENCOUNTER — Encounter
Admission: RE | Disposition: A | Discharge status: Home / Self Care | Payer: Self-pay | Source: Home / Self Care | Attending: Gastroenterology

## 2021-12-22 DIAGNOSIS — K640 First degree hemorrhoids: Secondary | ICD-10-CM | POA: Insufficient documentation

## 2021-12-22 DIAGNOSIS — F17200 Nicotine dependence, unspecified, uncomplicated: Secondary | ICD-10-CM | POA: Diagnosis not present

## 2021-12-22 DIAGNOSIS — K635 Polyp of colon: Secondary | ICD-10-CM | POA: Diagnosis not present

## 2021-12-22 DIAGNOSIS — I10 Essential (primary) hypertension: Secondary | ICD-10-CM | POA: Diagnosis not present

## 2021-12-22 DIAGNOSIS — D125 Benign neoplasm of sigmoid colon: Secondary | ICD-10-CM | POA: Diagnosis not present

## 2021-12-22 DIAGNOSIS — K219 Gastro-esophageal reflux disease without esophagitis: Secondary | ICD-10-CM | POA: Diagnosis not present

## 2021-12-22 DIAGNOSIS — D123 Benign neoplasm of transverse colon: Secondary | ICD-10-CM | POA: Insufficient documentation

## 2021-12-22 DIAGNOSIS — Z1211 Encounter for screening for malignant neoplasm of colon: Secondary | ICD-10-CM | POA: Diagnosis not present

## 2021-12-22 HISTORY — DX: Other intervertebral disc degeneration, lumbar region: M51.36

## 2021-12-22 HISTORY — PX: COLONOSCOPY WITH PROPOFOL: SHX5780

## 2021-12-22 HISTORY — DX: Other intervertebral disc degeneration, lumbar region without mention of lumbar back pain or lower extremity pain: M51.369

## 2021-12-22 HISTORY — PX: POLYPECTOMY: SHX5525

## 2021-12-22 SURGERY — COLONOSCOPY WITH PROPOFOL
Anesthesia: General | Site: Rectum

## 2021-12-22 MED ORDER — LIDOCAINE HCL (CARDIAC) PF 100 MG/5ML IV SOSY
PREFILLED_SYRINGE | INTRAVENOUS | Status: DC | PRN
  Administered 2021-12-22: 100 mg via INTRAVENOUS

## 2021-12-22 MED ORDER — SODIUM CHLORIDE 0.9 % IV SOLN: INTRAVENOUS | Status: DC

## 2021-12-22 MED ORDER — LACTATED RINGERS IV SOLN: INTRAVENOUS | Status: DC

## 2021-12-22 MED ORDER — PHENYLEPHRINE HCL (PRESSORS) 10 MG/ML IV SOLN
INTRAVENOUS | Status: DC | PRN
  Administered 2021-12-22: 100 ug via INTRAVENOUS

## 2021-12-22 MED ORDER — STERILE WATER FOR IRRIGATION IR SOLN
Status: DC | PRN
  Administered 2021-12-22: 60 mL

## 2021-12-22 MED ORDER — STERILE WATER FOR IRRIGATION IR SOLN
Status: DC | PRN
  Administered 2021-12-22: 100 mL

## 2021-12-22 MED ORDER — PROPOFOL 10 MG/ML IV BOLUS
INTRAVENOUS | Status: DC | PRN
  Administered 2021-12-22: 50 mg via INTRAVENOUS
  Administered 2021-12-22: 25 mg via INTRAVENOUS
  Administered 2021-12-22 (×2): 50 mg via INTRAVENOUS
  Administered 2021-12-22: 25 mg via INTRAVENOUS
  Administered 2021-12-22: 50 mg via INTRAVENOUS
  Administered 2021-12-22: 25 mg via INTRAVENOUS
  Administered 2021-12-22: 50 mg via INTRAVENOUS
  Administered 2021-12-22: 25 mg via INTRAVENOUS
  Administered 2021-12-22: 50 mg via INTRAVENOUS

## 2021-12-22 SURGICAL SUPPLY — 8 items
GOWN CVR UNV OPN BCK APRN NK (MISCELLANEOUS) ×2 IMPLANT
GOWN ISOL THUMB LOOP REG UNIV (MISCELLANEOUS) ×2
KIT PRC NS LF DISP ENDO (KITS) ×1 IMPLANT
KIT PROCEDURE OLYMPUS (KITS) ×1
MANIFOLD NEPTUNE II (INSTRUMENTS) ×1 IMPLANT
SNARE COLD EXACTO (MISCELLANEOUS) IMPLANT
TRAP ETRAP POLY (MISCELLANEOUS) IMPLANT
WATER STERILE IRR 250ML POUR (IV SOLUTION) ×1 IMPLANT

## 2021-12-22 NOTE — Op Note (Signed)
[facility] Patient Name: Ethan Snyder Procedure Date: 12/22/2021 7:21 AM MRN: [id number] Account #: [id number] Date of Birth: 1965-09-18 Admit Type: Outpatient Age: 56 Room: [facility] OR ROOM 01 Gender: Male Note Status: Finalized Instrument Name: [id number] Procedure:             Colonoscopy Indications:           Screening for colorectal malignant neoplasm Providers:             Lucilla Lame MD, MD Referring MD:          Charlynne Cousins (Referring MD) Medicines:             Propofol per Anesthesia Complications:         No immediate complications. Procedure:             Pre-Anesthesia Assessment:                        - Prior to the procedure, a History and Physical was                         performed, and patient medications and allergies were                         reviewed. The patient's tolerance of previous                         anesthesia was also reviewed. The risks and benefits                         of the procedure and the sedation options and risks                         were discussed with the patient. All questions were                         answered, and informed consent was obtained. Prior                         Anticoagulants: The patient has taken no previous                         anticoagulant or antiplatelet agents. ASA Grade                         Assessment: II - A patient with mild systemic disease.                         After reviewing the risks and benefits, the patient                         was deemed in satisfactory condition to undergo the                         procedure.                        After obtaining informed consent, the colonoscope was  passed under direct vision. Throughout the procedure,                         the patient's blood pressure, pulse, and oxygen                         saturations were monitored continuously. The                         Colonoscope was  introduced through the anus and                         advanced to the the cecum, identified by appendiceal                         orifice and ileocecal valve. The colonoscopy was                         performed without difficulty. The patient tolerated                         the procedure well. The quality of the bowel                         preparation was excellent. Findings:      The perianal and digital rectal examinations were normal.      Three sessile polyps were found in the transverse colon. The polyps were       3 to 7 mm in size. These polyps were removed with a cold snare.       Resection and retrieval were complete.      Three sessile polyps were found in the sigmoid colon. The polyps were 3       to 4 mm in size. These polyps were removed with a cold snare. Resection       and retrieval were complete.      Non-bleeding internal hemorrhoids were found during retroflexion. The       hemorrhoids were Grade I (internal hemorrhoids that do not prolapse). Impression:            - Three 3 to 7 mm polyps in the transverse colon,                         removed with a cold snare. Resected and retrieved.                        - Three 3 to 4 mm polyps in the sigmoid colon, removed                         with a cold snare. Resected and retrieved.                        - Non-bleeding internal hemorrhoids. Recommendation:        - Discharge patient to home.                        - Resume previous diet.                        -  If the pathology report reveals adenomatous tissue,                         then repeat the colonoscopy for surveillance in 5                         years. Procedure Code(s):     --- Professional ---                        [phone], Colonoscopy, flexible; with removal of                         tumor(s), polyp(s), or other lesion(s) by snare                         technique Diagnosis Code(s):     --- Professional ---                        Z12.11,  Encounter for screening for malignant neoplasm                         of colon                        K63.5, Polyp of colon CPT copyright 2019 American Medical Association. All rights reserved. The codes documented in this report are preliminary and upon coder review may  be revised to meet current compliance requirements. Lucilla Lame MD, MD 12/22/2021 8:18:08 AM This report has been signed electronically. Number of Addenda: 0 Note Initiated On: 12/22/2021 7:21 AM Scope Withdrawal Time: 0 hours 12 minutes 42 seconds  Total Procedure Duration: 0 hours 16 minutes 31 seconds  Estimated Blood Loss:  Estimated blood loss: none.      [facility]

## 2021-12-22 NOTE — H&P (Signed)
Lucilla Lame, MD [address] [place], [place] [address] Phone: [phone] Fax : [phone]  Primary Care Physician:  Charlynne Cousins, MD (Inactive) Primary Gastroenterologist:  Dr. Allen Norris  Pre-Procedure History & Physical: HPI:  Ethan Snyder is a 56 y.o. male is here for a screening colonoscopy.   Past Medical History:  Diagnosis Date   DDD (degenerative disc disease), lumbar    GERD (gastroesophageal reflux disease)    Hypertension     Past Surgical History:  Procedure Laterality Date   COLONOSCOPY W/ POLYPECTOMY     approx 2012/2013   HERNIA REPAIR      Prior to Admission medications   Medication Sig Start Date End Date Taking? Authorizing Provider  amLODipine (NORVASC) 10 MG tablet Take 1 tablet (10 mg total) by mouth daily. 10/12/21  Yes Kathrine Haddock, NP  fluticasone (FLONASE) 50 MCG/ACT nasal spray Place 2 sprays into both nostrils daily. 05/11/21  Yes Vigg, Avanti, MD  losartan (COZAAR) 50 MG tablet Take 1 tablet (50 mg total) by mouth daily. 10/12/21  Yes Kathrine Haddock, NP  omeprazole (PRILOSEC OTC) 20 MG tablet Take 1 tablet by mouth daily.   Yes [provider]  rosuvastatin (CRESTOR) 10 MG tablet Take 1 tablet (10 mg total) by mouth daily. 05/11/21  Yes Vigg, Avanti, MD  polyethylene glycol (GOLYTELY) 236 g solution At 5pm the evening before colonoscopy-mix bowel prep with clear liquid.  Drink 8 oz every 20 mins until half has been completed.  Continue clear liquid diet.  5 hours before procedure time resume drinking 8 oz every 20 mins until entire contents have been completed.  Do not eat or drink anything 4 hours prior to colonoscopy. 10/13/21   Lucilla Lame, MD    Allergies as of 10/13/2021 - Review Complete 10/12/2021  Allergen Reaction Noted   Banana Itching, Swelling, and Other (See Comments) 04/14/2021   Bee venom Swelling 04/14/2021   Lisinopril  02/12/2017   Ibuprofen  03/21/2020    Family History  Problem Relation Age of  Onset   Healthy Mother    Lung cancer Father    Heart disease Brother     Social History   Socioeconomic History   Marital status: Single    Spouse name: Not on file   Number of children: Not on file   Years of education: Not on file   Highest education level: Not on file  Occupational History   Not on file  Tobacco Use   Smoking status: Every Day    Packs/day: 1.00    Years: 40.00    Total pack years: 40.00    Types: Cigarettes    Passive exposure: Current   Smokeless tobacco: Never  Vaping Use   Vaping Use: Never used  Substance and Sexual Activity   Alcohol use: Yes    Alcohol/week: 12.0 standard drinks of alcohol    Types: 12 Cans of beer per week   Drug use: Never   Sexual activity: Not Currently  Other Topics Concern   Not on file  Social History Narrative   Not on file   Social Determinants of Health   Financial Resource Strain: Not on file  Food Insecurity: Not on file  Transportation Needs: Not on file  Physical Activity: Not on file  Stress: Not on file  Social Connections: Not on file  Intimate Partner Violence: Not on file    Review of Systems: See HPI, otherwise negative ROS  Physical Exam: BP 116/80  Pulse 74   Temp 97.9 F (36.6 C) (Temporal)   Ht 5' 11.5" (1.816 m)   Wt 92.4 kg   SpO2 97%   BMI 28.01 kg/m  General:   Alert,  pleasant and cooperative in NAD Head:  Normocephalic and atraumatic. Neck:  Supple; no masses or thyromegaly. Lungs:  Clear throughout to auscultation.    Heart:  Regular rate and rhythm. Abdomen:  Soft, nontender and nondistended. Normal bowel sounds, without guarding, and without rebound.   Neurologic:  Alert and  oriented x4;  grossly normal neurologically.  Impression/Plan: Ethan Snyder is now here to undergo a screening colonoscopy.  Risks, benefits, and alternatives regarding colonoscopy have been reviewed with the patient.  Questions have been answered.  All parties agreeable.

## 2021-12-22 NOTE — Transfer of Care (Signed)
Immediate Anesthesia Transfer of Care Note  Patient: Ethan Snyder  Procedure(s) Performed: COLONOSCOPY WITH PROPOFOL (Rectum) POLYPECTOMY (Rectum)  Patient Location: PACU  Anesthesia Type: General  Level of Consciousness: awake, alert  and patient cooperative  Airway and Oxygen Therapy: Patient Spontanous Breathing and Patient connected to supplemental oxygen  Post-op Assessment: Post-op Vital signs reviewed, Patient's Cardiovascular Status Stable, Respiratory Function Stable, Patent Airway and No signs of Nausea or vomiting  Post-op Vital Signs: Reviewed and stable  Complications: There were no known notable events for this encounter.

## 2021-12-22 NOTE — Anesthesia Postprocedure Evaluation (Signed)
Anesthesia Post Note  Patient: Ethan Snyder  Procedure(s) Performed: COLONOSCOPY WITH PROPOFOL (Rectum) POLYPECTOMY (Rectum)     Patient location during evaluation: PACU Anesthesia Type: General Level of consciousness: awake and alert Pain management: pain level controlled Vital Signs Assessment: post-procedure vital signs reviewed and stable Respiratory status: spontaneous breathing, nonlabored ventilation, respiratory function stable and patient connected to nasal cannula oxygen Cardiovascular status: blood pressure returned to baseline and stable Postop Assessment: no apparent nausea or vomiting Anesthetic complications: no   There were no known notable events for this encounter.  Martha Clan

## 2021-12-22 NOTE — Anesthesia Preprocedure Evaluation (Signed)
Anesthesia Evaluation  Patient identified by MRN, date of birth, ID band Patient awake    Reviewed: Allergy & Precautions, H&P , NPO status , Patient's Chart, lab work & pertinent test results, reviewed documented beta blocker date and time   History of Anesthesia Complications Negative for: history of anesthetic complications  Airway Mallampati: I  TM Distance: >3 FB Neck ROM: full    Dental  (+) Dental Advidsory Given, Teeth Intact   Pulmonary neg shortness of breath, neg COPD, neg recent URI, Current Smoker,    Pulmonary exam normal breath sounds clear to auscultation       Cardiovascular Exercise Tolerance: Good hypertension, (-) angina(-) Past MI and (-) Cardiac Stents Normal cardiovascular exam(-) dysrhythmias (-) Valvular Problems/Murmurs Rhythm:regular Rate:Normal     Neuro/Psych negative neurological ROS  negative psych ROS   GI/Hepatic Neg liver ROS, GERD  ,  Endo/Other  negative endocrine ROS  Renal/GU Renal disease (kidney stones)  negative genitourinary   Musculoskeletal   Abdominal   Peds  Hematology negative hematology ROS (+)   Anesthesia Other Findings Past Medical History: No date: DDD (degenerative disc disease), lumbar No date: GERD (gastroesophageal reflux disease) No date: Hypertension   Reproductive/Obstetrics negative OB ROS                             Anesthesia Physical Anesthesia Plan  ASA: 2  Anesthesia Plan: General   Post-op Pain Management:    Induction: Intravenous  PONV Risk Score and Plan: 1 and Propofol infusion and TIVA  Airway Management Planned: Natural Airway and Nasal Cannula  Additional Equipment:   Intra-op Plan:   Post-operative Plan:   Informed Consent: I have reviewed the patients History and Physical, chart, labs and discussed the procedure including the risks, benefits and alternatives for the proposed anesthesia with the  patient or authorized representative who has indicated his/her understanding and acceptance.     Dental Advisory Given  Plan Discussed with: Anesthesiologist, CRNA and Surgeon  Anesthesia Plan Comments:         Anesthesia Quick Evaluation

## 2021-12-23 ENCOUNTER — Encounter: Payer: Self-pay | Admitting: Gastroenterology

## 2021-12-26 ENCOUNTER — Encounter: Payer: Self-pay | Admitting: Gastroenterology

## 2021-12-26 LAB — SURGICAL PATHOLOGY

## 2021-12-29 ENCOUNTER — Ambulatory Visit: Admission: RE | Admit: 2021-12-29 | Payer: BC Managed Care – PPO | Source: Home / Self Care | Admitting: Urology

## 2021-12-29 ENCOUNTER — Encounter: Admission: RE | Payer: Self-pay | Source: Home / Self Care

## 2021-12-29 SURGERY — LITHOTRIPSY, ESWL
Anesthesia: Moderate Sedation | Laterality: Left

## 2022-02-27 IMAGING — CT CT CHEST LUNG CANCER SCREENING LOW DOSE W/O CM
2 of 5 series · 15 of 40 positions shown, 18 images · non-contrast
Comparison: None.

CLINICAL DATA: 55-year-old asymptomatic male current smoker with 48
pack-year smoking history.

EXAM:
CT CHEST WITHOUT CONTRAST LOW-DOSE FOR LUNG CANCER SCREENING
TECHNIQUE: Multidetector CT imaging of the chest was performed following the
standard protocol without IV contrast.

[Series 3: lung 1.00 · axial · 0.78mm/px · z∈[-1213,-911]mm · 12 of 334 slices shown, 15 images]
[im 16/334  mediastinal]
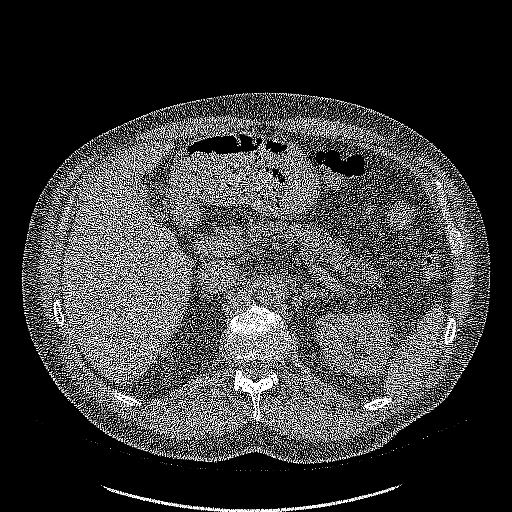
[im 16/334  lung]
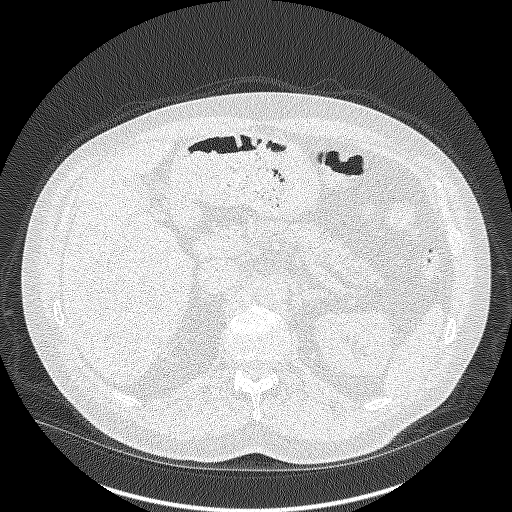
[im 46/334  lung]
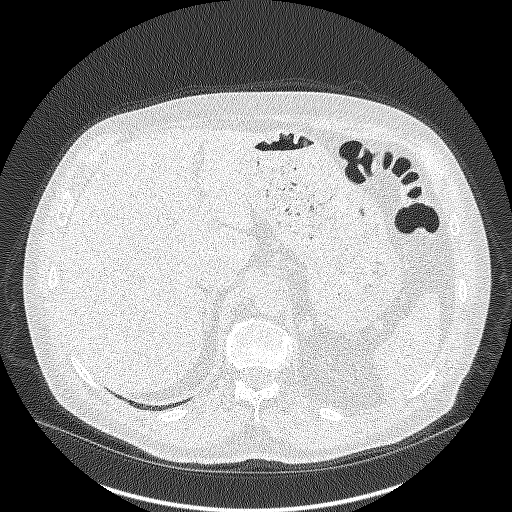
[im 76/334  lung]
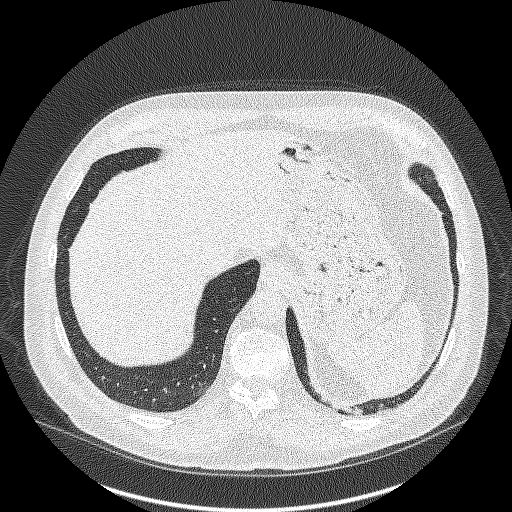
[im 106/334  lung]
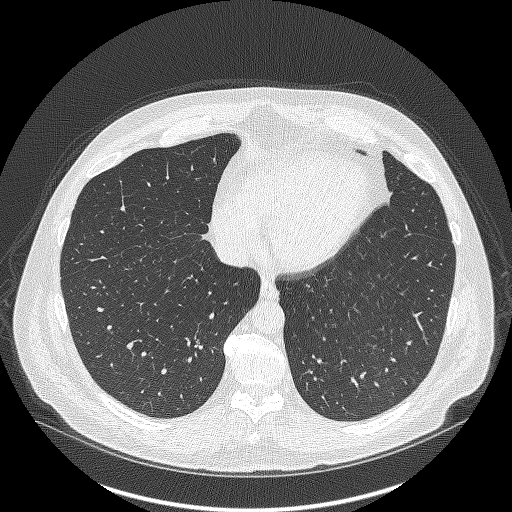
[im 122/334  mediastinal]
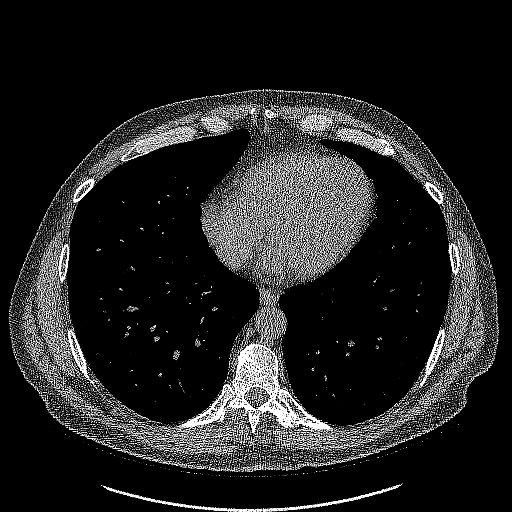
[im 122/334  lung]
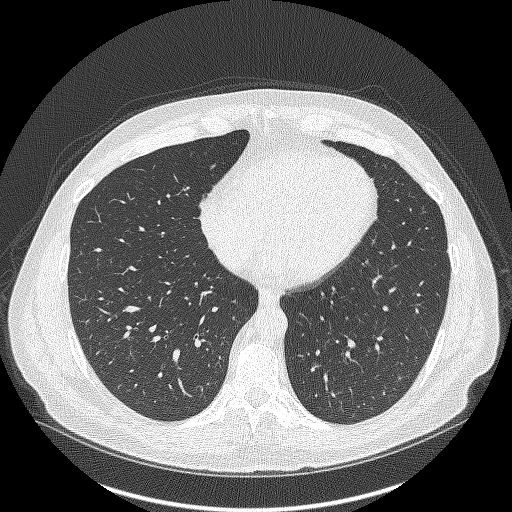
[im 152/334  lung]
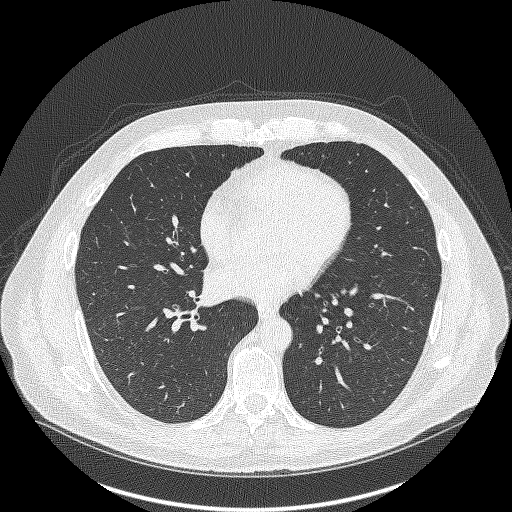
[im 182/334  lung]
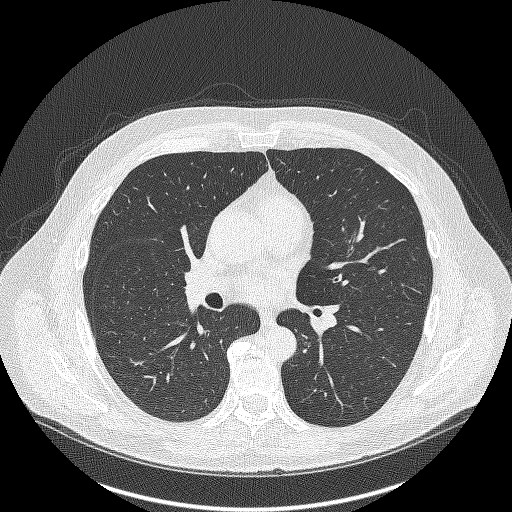
[im 212/334  lung]
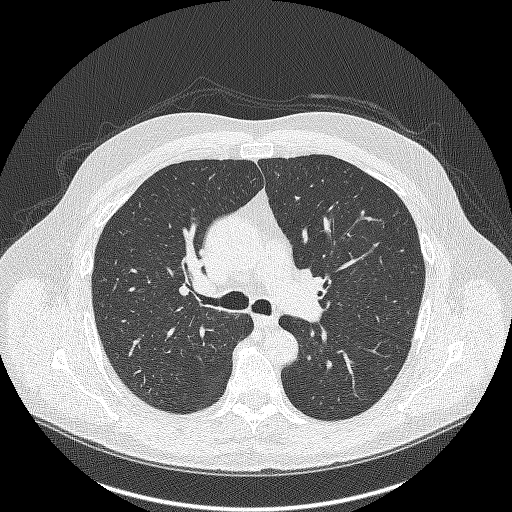
[im 228/334  mediastinal]
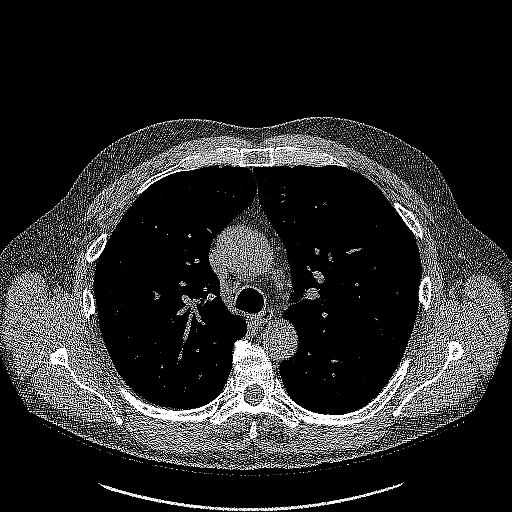
[im 228/334  lung]
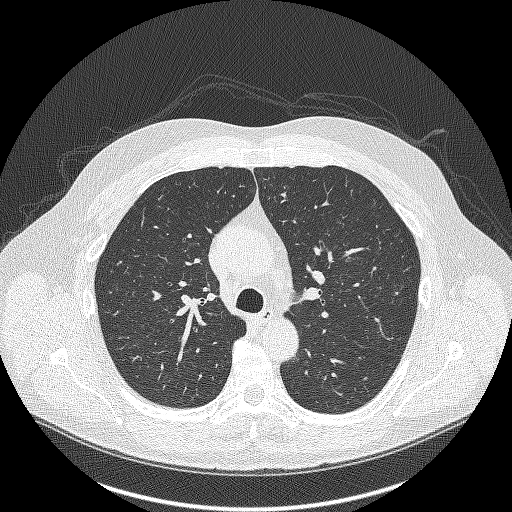
[im 258/334  lung]
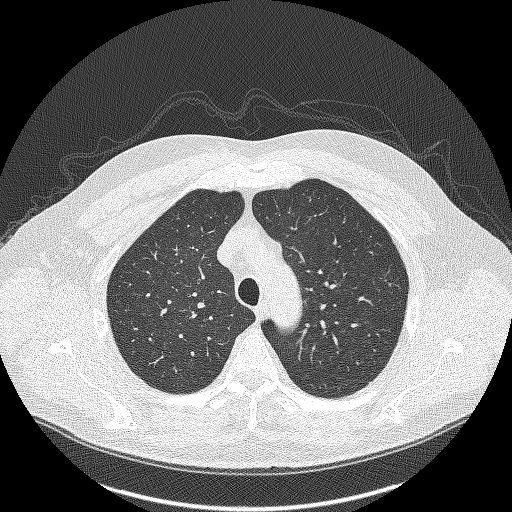
[im 288/334  lung]
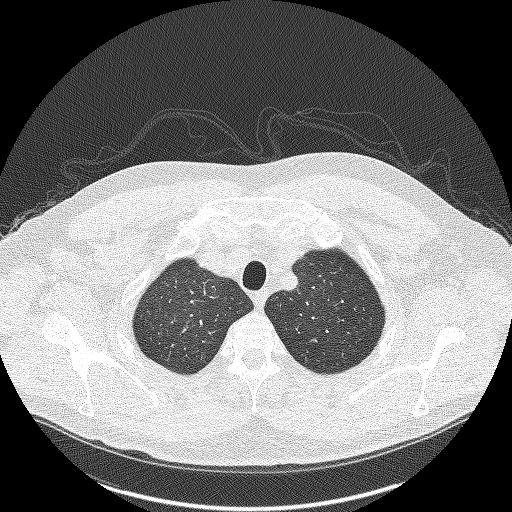
[im 318/334  lung]
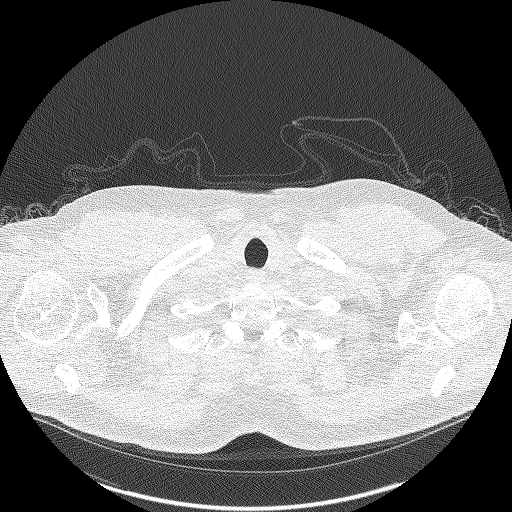

[Series 5: coronals lung 1.00 cor · coronal · 0.65mm/px · 3 of 397 slices shown]
[im 80/397  lung]
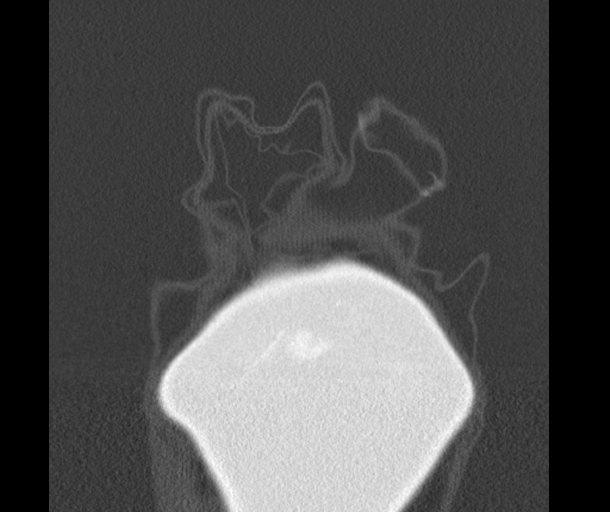
[im 159/397  lung]
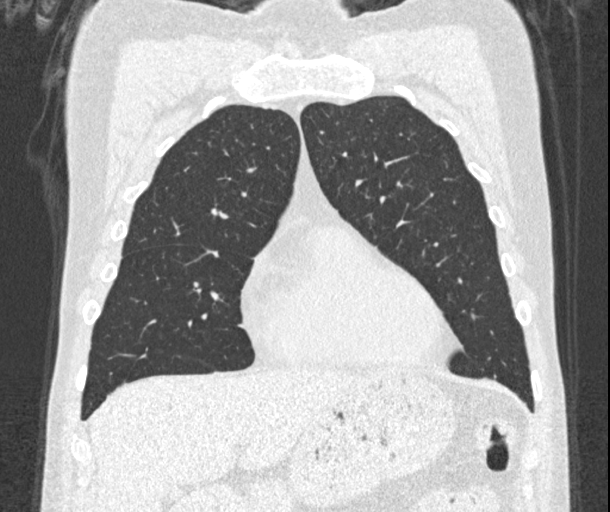
[im 238/397  lung]
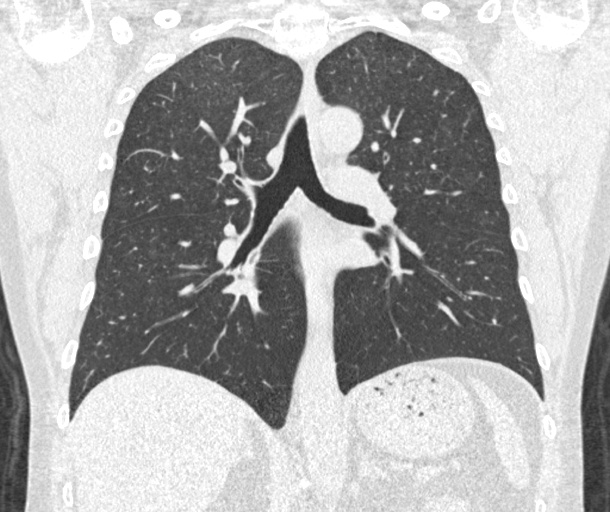

[15 of 40 positions shown; findings below may reference images not displayed]

FINDINGS: Cardiovascular: Normal heart size. No significant pericardial
effusion/thickening. Atherosclerotic thoracic aorta with mildly
dilated 4.1 cm ascending thoracic aorta. Normal caliber pulmonary
arteries.

Mediastinum/Nodes: No discrete thyroid nodules. Unremarkable
esophagus. No pathologically enlarged axillary, mediastinal or hilar
lymph nodes, noting limited sensitivity for the detection of hilar
adenopathy on this noncontrast study.

Lungs/Pleura: No pneumothorax. No pleural effusion. Mild
centrilobular emphysema with diffuse bronchial wall thickening. No
acute consolidative airspace disease or lung masses. No significant
pulmonary nodules.

Upper abdomen: Nonobstructing 2 mm upper left renal stone.

Musculoskeletal: No aggressive appearing focal osseous lesions.
Moderate thoracic spondylosis.
IMPRESSION: 1. Lung-RADS 1, negative. Continue annual screening with low-dose
chest CT without contrast in 12 months.
2. Dilated 4.1 cm ascending thoracic aorta, which can be reassessed
on follow-up screening chest CT in 12 months.
3. Nonobstructing left nephrolithiasis.
4. Aortic Atherosclerosis (6TLOJ-C6T.T) and Emphysema (6TLOJ-Z8M.F).

## 2022-03-30 ENCOUNTER — Ambulatory Visit
Admission: RE | Admit: 2022-03-30 | Discharge: 2022-03-30 | Disposition: A | Discharge status: Home / Self Care | Payer: BC Managed Care – PPO | Source: Ambulatory Visit | Attending: Acute Care | Admitting: Acute Care

## 2022-03-30 DIAGNOSIS — F17210 Nicotine dependence, cigarettes, uncomplicated: Secondary | ICD-10-CM | POA: Insufficient documentation

## 2022-03-30 DIAGNOSIS — Z87891 Personal history of nicotine dependence: Secondary | ICD-10-CM | POA: Insufficient documentation

## 2022-04-04 ENCOUNTER — Other Ambulatory Visit: Payer: Self-pay | Admitting: Acute Care

## 2022-04-04 DIAGNOSIS — Z87891 Personal history of nicotine dependence: Secondary | ICD-10-CM

## 2022-04-04 DIAGNOSIS — Z122 Encounter for screening for malignant neoplasm of respiratory organs: Secondary | ICD-10-CM

## 2022-04-04 DIAGNOSIS — F17210 Nicotine dependence, cigarettes, uncomplicated: Secondary | ICD-10-CM

## 2022-04-11 ENCOUNTER — Other Ambulatory Visit: Payer: Self-pay

## 2022-04-11 NOTE — Telephone Encounter (Unsigned)
Copied from [place] [id number]. Topic: General - Other >> Apr 11, 2022  9:54 AM Everette C wrote: Reason for CRM: Medication Refill - Medication: losartan (COZAAR) 50 MG tablet [[id number]]  amLODipine (NORVASC) 10 MG tablet [[id number]]  Has the patient contacted their pharmacy? Yes.   (Agent: If no, request that the patient contact the pharmacy for the refill. If patient does not wish to contact the pharmacy document the reason why and proceed with request.) (Agent: If yes, when and what did the pharmacy advise?)  Preferred Pharmacy (with phone number or street name): [facility], [place] [address] [place] [address] Phone: [phone] Fax: [phone] Hours: Not open 24 hours   Has the patient been seen for an appointment in the last year OR does the patient have an upcoming appointment? Yes.    Agent: Please be advised that RX refills may take up to 3 business days. We ask that you follow-up with your pharmacy.

## 2022-04-12 NOTE — Telephone Encounter (Signed)
Unable to refill per protocol, Rx request is too soon. Last refill 10/12/21 for 90 and 3 refills.  Requested Prescriptions  Pending Prescriptions Disp Refills   losartan (COZAAR) 50 MG tablet 90 tablet 3    Sig: Take 1 tablet (50 mg total) by mouth daily.     Cardiovascular:  Angiotensin Receptor Blockers Failed - 04/11/2022 11:39 AM      Failed - Cr in normal range and within 180 days    Creatinine, Ser  Date Value Ref Range Status  10/12/2021 1.14 0.76 - 1.27 mg/dL Final         Failed - K in normal range and within 180 days    Potassium  Date Value Ref Range Status  10/12/2021 4.6 3.5 - 5.2 mmol/L Final         Failed - Valid encounter within last 6 months    Recent Outpatient Visits           6 months ago Primary hypertension   [facility] Kathrine Haddock, NP   11 months ago Viral upper respiratory tract infection   [facility] Vigg, Avanti, MD   12 months ago Aortic atherosclerosis ([place])   [facility] Vigg, Avanti, MD   1 year ago Primary hypertension   [place] Vigg, Avanti, MD   1 year ago Chronic bilateral low back pain with bilateral sciatica   [place], Avanti, MD       Future Appointments             In 2 days Mecum, Dani Gobble, PA-C [place], [place] - Patient is not pregnant      Passed - Last BP in normal range    BP Readings from Last 1 Encounters:  12/22/21 112/78          amLODipine (NORVASC) 10 MG tablet 90 tablet 3    Sig: Take 1 tablet (10 mg total) by mouth daily.     Cardiovascular: Calcium Channel Blockers 2 Failed - 04/11/2022 11:39 AM      Failed - Valid encounter within last 6 months    Recent Outpatient Visits           6 months ago Primary hypertension   [facility] Kathrine Haddock, NP   11 months ago Viral upper respiratory tract infection   [facility] Vigg, Avanti, MD   12 months ago Aortic  atherosclerosis ([place])   [facility] Vigg, Avanti, MD   1 year ago Primary hypertension   [place] Vigg, Avanti, MD   1 year ago Chronic bilateral low back pain with bilateral sciatica   [place], MD       Future Appointments             In 2 days Mecum, Dani Gobble, PA-C [facility]            Passed - Last BP in normal range    BP Readings from Last 1 Encounters:  12/22/21 112/78         Passed - Last Heart Rate in normal range    Pulse Readings from Last 1 Encounters:  12/22/21 79

## 2022-04-14 ENCOUNTER — Ambulatory Visit: Payer: BC Managed Care – PPO | Admitting: Physician Assistant

## 2022-04-14 ENCOUNTER — Encounter: Payer: Self-pay | Admitting: Physician Assistant

## 2022-04-14 VITALS — BP 122/74 | HR 60 | Temp 98.3°F | Ht 71.5 in | Wt 209.7 lb

## 2022-04-14 DIAGNOSIS — K219 Gastro-esophageal reflux disease without esophagitis: Secondary | ICD-10-CM | POA: Diagnosis not present

## 2022-04-14 DIAGNOSIS — Z131 Encounter for screening for diabetes mellitus: Secondary | ICD-10-CM

## 2022-04-14 DIAGNOSIS — I10 Essential (primary) hypertension: Secondary | ICD-10-CM | POA: Diagnosis not present

## 2022-04-14 DIAGNOSIS — E7849 Other hyperlipidemia: Secondary | ICD-10-CM

## 2022-04-14 MED ORDER — ROSUVASTATIN CALCIUM 10 MG PO TABS: 10.0000 mg | ORAL_TABLET | Freq: Every day | ORAL | 1 refills | Status: DC

## 2022-04-14 NOTE — Progress Notes (Unsigned)
Established Patient Office Visit  Name: Ethan Snyder   MRN: [id number]    DOB: [birth year]   Date:04/17/2022  Today's Provider: Talitha Givens, MHS, PA-C Introduced myself to the patient as a PA-C and provided education on APPs in clinical practice.         Subjective  Chief Complaint  Chief Complaint  Patient presents with   Hyperlipidemia   Hypertension   Gastroesophageal Reflux    HPI   HYPERTENSION / HYPERLIPIDEMIA Satisfied with current treatment? yes Duration of hypertension: years BP monitoring frequency: rarely BP range:  BP medication side effects: no Past BP meds: amlodipine and losartan (cozaar) Duration of hyperlipidemia: chronic Cholesterol medication side effects: no Cholesterol supplements: none Past cholesterol medications: rosuvastatin (crestor) Medication compliance: excellent compliance Aspirin: no Recent stressors: no Recurrent headaches: no Visual changes: no Palpitations: no Dyspnea: no Chest pain: no Lower extremity edema: no Dizzy/lightheaded: no  GERD GERD control status: controlled Satisfied with current treatment? yes Heartburn frequency: rare with medication use  Medication side effects: no  Medication compliance: stable Previous GERD medications: Using Prilosec  Antacid use frequency: rarely has to use TUMS or PRN medications Duration:  Nature:  Location: burning throat and burning in chest  Heartburn duration:  Alleviatiating factors:  Prilosec  Aggravating factors:  Dysphagia: yes Odynophagia:  yes Hematemesis: no Blood in stool: no EGD: yes several years ago       Patient Active Problem List   Diagnosis Date Noted   Encounter for screening colonoscopy    Polyp of transverse colon    GERD (gastroesophageal reflux disease) 10/12/2021   Nephrolithiasis 10/12/2021   Hyperlipidemia 10/12/2021   Carpal tunnel syndrome on right 10/12/2021   SOB (shortness of breath) 05/11/2021   Aortic atherosclerosis  ([place]) 01/13/2021   Pure hypertriglyceridemia 01/13/2021   Primary hypertension 01/12/2021   Chronic bilateral low back pain with bilateral sciatica 01/12/2021    Past Surgical History:  Procedure Laterality Date   COLONOSCOPY W/ POLYPECTOMY     approx 2012/2013   COLONOSCOPY WITH PROPOFOL N/A 12/22/2021   Procedure: COLONOSCOPY WITH PROPOFOL;  Surgeon: Lucilla Lame, MD;  Location: [facility];  Service: Endoscopy;  Laterality: N/A;   HERNIA REPAIR     POLYPECTOMY N/A 12/22/2021   Procedure: POLYPECTOMY;  Surgeon: Lucilla Lame, MD;  Location: [facility];  Service: Endoscopy;  Laterality: N/A;    Family History  Problem Relation Age of Onset   Healthy Mother    Lung cancer Father    Heart disease Brother     Social History   Tobacco Use   Smoking status: Every Day    Packs/day: 1.00    Years: 40.00    Total pack years: 40.00    Types: Cigarettes    Passive exposure: Current   Smokeless tobacco: Never  Substance Use Topics   Alcohol use: Yes    Alcohol/week: 12.0 standard drinks of alcohol    Types: 12 Cans of beer per week     Current Outpatient Medications:    amLODipine (NORVASC) 10 MG tablet, Take 1 tablet (10 mg total) by mouth daily., Disp: 90 tablet, Rfl: 3   losartan (COZAAR) 50 MG tablet, Take 1 tablet (50 mg total) by mouth daily., Disp: 90 tablet, Rfl: 3   omeprazole (PRILOSEC OTC) 20 MG tablet, Take 1 tablet by mouth daily., Disp: , Rfl:    polyethylene glycol (GOLYTELY) 236 g solution, At 5pm the evening before colonoscopy-mix bowel  prep with clear liquid.  Drink 8 oz every 20 mins until half has been completed.  Continue clear liquid diet.  5 hours before procedure time resume drinking 8 oz every 20 mins until entire contents have been completed.  Do not eat or drink anything 4 hours prior to colonoscopy., Disp: 4000 mL, Rfl: 0   rosuvastatin (CRESTOR) 10 MG tablet, Take 1 tablet (10 mg total) by mouth daily., Disp: 90 tablet, Rfl:  1  Allergies  Allergen Reactions   Banana Itching, Swelling and Other (See Comments)    Feels tingling all over   Bee Venom Swelling   Lisinopril     Other reaction(s): Cough   Ibuprofen     Other reaction(s): Other (See Comments) Other Reaction: GI UPSET    I personally reviewed active problem list, medication list, allergies, health maintenance, notes from last encounter, lab results with the patient/caregiver today.   Review of Systems  Constitutional:  Negative for malaise/fatigue.  Eyes:  Negative for blurred vision, double vision and photophobia.  Respiratory:  Negative for shortness of breath and wheezing.   Cardiovascular:  Negative for chest pain, palpitations and leg swelling.  Gastrointestinal:  Negative for heartburn, nausea and vomiting.  Neurological:  Negative for dizziness and headaches.      Objective  Vitals:   04/14/22 0908  BP: 122/74  Pulse: 60  Temp: 98.3 F (36.8 C)  TempSrc: Oral  SpO2: 100%  Weight: 209 lb 11.2 oz (95.1 kg)  Height: 5' 11.5" (1.816 m)    Body mass index is 28.84 kg/m.  Physical Exam Vitals reviewed.  Constitutional:      General: He is awake.     Appearance: Normal appearance. He is well-developed and well-groomed.  HENT:     Head: Normocephalic and atraumatic.     Mouth/Throat:     Mouth: Mucous membranes are moist.     Pharynx: No oropharyngeal exudate or posterior oropharyngeal erythema.  Eyes:     Extraocular Movements: Extraocular movements intact.     Conjunctiva/sclera: Conjunctivae normal.  Cardiovascular:     Rate and Rhythm: Normal rate and regular rhythm.     Pulses: Normal pulses.          Radial pulses are 2+ on the right side and 2+ on the left side.     Heart sounds: Normal heart sounds.  Pulmonary:     Effort: Pulmonary effort is normal.     Breath sounds: Normal breath sounds. No decreased air movement. No decreased breath sounds, wheezing, rhonchi or rales.  Musculoskeletal:     Cervical  back: Normal range of motion and neck supple.     Right lower leg: No edema.     Left lower leg: No edema.  Skin:    General: Skin is warm.     Capillary Refill: Capillary refill takes less than 2 seconds.  Neurological:     General: No focal deficit present.     Mental Status: He is alert and oriented to person, place, and time.  Psychiatric:        Mood and Affect: Mood normal.        Behavior: Behavior normal. Behavior is cooperative.        Thought Content: Thought content normal.        Judgment: Judgment normal.      Recent Results (from the past 2160 hour(s))  Lipid Profile     Status: None   Collection Time: 04/14/22  9:50 AM  Result Value  Ref Range   Cholesterol, Total 144 100 - 199 mg/dL   Triglycerides 68 0 - 149 mg/dL   HDL 56 >39 mg/dL   VLDL Cholesterol Cal 14 5 - 40 mg/dL   LDL Chol Calc (NIH) 74 0 - 99 mg/dL   Chol/HDL Ratio 2.6 0.0 - 5.0 ratio    Comment:                                   T. Chol/HDL Ratio                                             Men  Women                               1/2 Avg.Risk  3.4    3.3                                   Avg.Risk  5.0    4.4                                2X Avg.Risk  9.6    7.1                                3X Avg.Risk 23.4   11.0   CBC w/Diff     Status: None   Collection Time: 04/14/22  9:50 AM  Result Value Ref Range   WBC 6.5 3.4 - 10.8 x10E3/uL   RBC 5.72 4.14 - 5.80 x10E6/uL   Hemoglobin 16.1 13.0 - 17.7 g/dL   Hematocrit 47.7 37.5 - 51.0 %   MCV 83 79 - 97 fL   MCH 28.1 26.6 - 33.0 pg   MCHC 33.8 31.5 - 35.7 g/dL   RDW 12.8 11.6 - 15.4 %   Platelets 241 150 - 450 x10E3/uL   Neutrophils 66 Not Estab. %   Lymphs 23 Not Estab. %   Monocytes 10 Not Estab. %   Eos 1 Not Estab. %   Basos 0 Not Estab. %   Neutrophils Absolute 4.3 1.4 - 7.0 x10E3/uL   Lymphocytes Absolute 1.5 0.7 - 3.1 x10E3/uL   Monocytes Absolute 0.6 0.1 - 0.9 x10E3/uL   EOS (ABSOLUTE) 0.1 0.0 - 0.4 x10E3/uL   Basophils Absolute  0.0 0.0 - 0.2 x10E3/uL   Immature Granulocytes 0 Not Estab. %   Immature Grans (Abs) 0.0 0.0 - 0.1 x10E3/uL  Comp Met (CMET)     Status: None   Collection Time: 04/14/22  9:50 AM  Result Value Ref Range   Glucose 96 70 - 99 mg/dL   BUN 13 6 - 24 mg/dL   Creatinine, Ser 1.12 0.76 - 1.27 mg/dL   eGFR 77 >59 mL/min/1.73   BUN/Creatinine Ratio 12 9 - 20   Sodium 140 134 - 144 mmol/L   Potassium 4.5 3.5 - 5.2 mmol/L   Chloride 102 96 - 106 mmol/L   CO2 27 20 - 29 mmol/L   Calcium 9.2 8.7 - 10.2 mg/dL   Total Protein 6.8 6.0 -  8.5 g/dL   Albumin 4.7 3.8 - 4.9 g/dL   Globulin, Total 2.1 1.5 - 4.5 g/dL   Albumin/Globulin Ratio 2.2 1.2 - 2.2   Bilirubin Total 0.5 0.0 - 1.2 mg/dL   Alkaline Phosphatase 76 44 - 121 IU/L   AST 25 0 - 40 IU/L   ALT 27 0 - 44 IU/L  HgB A1c     Status: Abnormal   Collection Time: 04/14/22  9:50 AM  Result Value Ref Range   Hgb A1c MFr Bld 5.7 (H) 4.8 - 5.6 %    Comment:          Prediabetes: 5.7 - 6.4          Diabetes: >6.4          Glycemic control for adults with diabetes: <7.0    Est. average glucose Bld gHb Est-mCnc 117 mg/dL     PHQ2/9:    04/14/2022    9:12 AM 10/12/2021    9:48 AM 05/11/2021   10:47 AM 01/12/2021    9:31 AM 12/28/2020    9:07 AM  Depression screen PHQ 2/9  Decreased Interest 1 1 0 0 0  Down, Depressed, Hopeless 0 1 0 0 0  PHQ - 2 Score 1 2 0 0 0  Altered sleeping 1 2 0 0 0  Tired, decreased energy 1 3 0 0 1  Change in appetite 0 0 0 0 0  Feeling bad or failure about yourself  0 1 0 0 0  Trouble concentrating 0 0 0 0 0  Moving slowly or fidgety/restless 0 1 0 0 0  Suicidal thoughts 0 0 0 0 0  PHQ-9 Score 3 9 0 0 1  Difficult doing work/chores  Very difficult Not difficult at all  Not difficult at all      Fall Risk:    04/14/2022    9:13 AM 10/12/2021    9:48 AM 05/11/2021   10:47 AM 01/12/2021    9:30 AM 12/28/2020    9:07 AM  [place] in the past year? 0 0 0 0 0  Number falls in past yr: 0 0 0 0 0  Injury  with Fall? 0 0 0 0 0  Risk for fall due to : No Fall Risks No Fall Risks No Fall Risks No Fall Risks No Fall Risks  Follow up Falls evaluation completed Falls evaluation completed Falls evaluation completed Falls evaluation completed Falls evaluation completed      Functional Status Survey:      Assessment & Plan  Problem List Items Addressed This Visit       Cardiovascular and Mediastinum   Primary hypertension    Chronic, stable  Currently taking Amlodipine 10 mg PO QD, Losartan 50 mg PO Qdand appears to be tolerating well Continue current regimen Encouraged diet and exercise for further management Follow up in 6 months or sooner if concerns arise       Relevant Medications   rosuvastatin (CRESTOR) 10 MG tablet   Other Relevant Orders   CBC w/Diff (Completed)   Comp Met (CMET) (Completed)     Digestive   GERD (gastroesophageal reflux disease)    Chronic, stable and well managed at this time with Prilosec Discussed trying Pepcid if he wishes to reduce Prilosec use and can use PRN medications as needed He voiced understanding and stated he would consider trial change if tolerable Follow up every 6 months or as needed for concerns  Other   Hyperlipidemia - Primary    Chronic, stable Appears well controlled on Rosuvastatin 10 mg PO QD  Continue current regimen Recheck lipid panel today- results to dictate further management Follow up in 6 months for monitoring       Relevant Medications   rosuvastatin (CRESTOR) 10 MG tablet   Other Relevant Orders   Lipid Profile (Completed)   Other Visit Diagnoses     Screening for diabetes mellitus (DM)       Relevant Orders   HgB A1c (Completed)        Return in about 6 months (around 10/13/2022) for HTN,HLD.   I, Arrayah Connors E Tiarra Anastacio, PA-C, have reviewed all documentation for this visit. The documentation on 04/17/22 for the exam, diagnosis, procedures, and orders are all accurate and complete.   Talitha Givens,  MHS, PA-C [facility]

## 2022-04-15 LAB — LIPID PANEL
Chol/HDL Ratio: 2.6 ratio (ref 0.0–5.0)
Cholesterol, Total: 144 mg/dL (ref 100–199)
HDL: 56 mg/dL (ref >39)
LDL Chol Calc (NIH): 74 mg/dL (ref 0–99)
Triglycerides: 68 mg/dL (ref 0–149)
VLDL Cholesterol Cal: 14 mg/dL (ref 5–40)

## 2022-04-15 LAB — CBC WITH DIFFERENTIAL/PLATELET
Basophils Absolute: 0 10*3/uL (ref 0.0–0.2)
Basos: 0 %
EOS (ABSOLUTE): 0.1 10*3/uL (ref 0.0–0.4)
Eos: 1 %
Hematocrit: 47.7 % (ref 37.5–51.0)
Hemoglobin: 16.1 g/dL (ref 13.0–17.7)
Immature Grans (Abs): 0 10*3/uL (ref 0.0–0.1)
Immature Granulocytes: 0 %
Lymphocytes Absolute: 1.5 10*3/uL (ref 0.7–3.1)
Lymphs: 23 %
MCH: 28.1 pg (ref 26.6–33.0)
MCHC: 33.8 g/dL (ref 31.5–35.7)
MCV: 83 fL (ref 79–97)
Monocytes Absolute: 0.6 10*3/uL (ref 0.1–0.9)
Monocytes: 10 %
Neutrophils Absolute: 4.3 10*3/uL (ref 1.4–7.0)
Neutrophils: 66 %
Platelets: 241 10*3/uL (ref 150–450)
RBC: 5.72 x10E6/uL (ref 4.14–5.80)
RDW: 12.8 % (ref 11.6–15.4)
WBC: 6.5 10*3/uL (ref 3.4–10.8)

## 2022-04-15 LAB — COMPREHENSIVE METABOLIC PANEL
ALT: 27 IU/L (ref 0–44)
AST: 25 IU/L (ref 0–40)
Albumin/Globulin Ratio: 2.2 (ref 1.2–2.2)
Albumin: 4.7 g/dL (ref 3.8–4.9)
Alkaline Phosphatase: 76 IU/L (ref 44–121)
BUN/Creatinine Ratio: 12 (ref 9–20)
BUN: 13 mg/dL (ref 6–24)
Bilirubin Total: 0.5 mg/dL (ref 0.0–1.2)
CO2: 27 mmol/L (ref 20–29)
Calcium: 9.2 mg/dL (ref 8.7–10.2)
Chloride: 102 mmol/L (ref 96–106)
Creatinine, Ser: 1.12 mg/dL (ref 0.76–1.27)
Globulin, Total: 2.1 g/dL (ref 1.5–4.5)
Glucose: 96 mg/dL (ref 70–99)
Potassium: 4.5 mmol/L (ref 3.5–5.2)
Sodium: 140 mmol/L (ref 134–144)
Total Protein: 6.8 g/dL (ref 6.0–8.5)
eGFR: 77 mL/min/{1.73_m2} (ref >59)

## 2022-04-15 LAB — HEMOGLOBIN A1C
Est. average glucose Bld gHb Est-mCnc: 117 mg/dL
Hgb A1c MFr Bld: 5.7 % — ABNORMAL HIGH (ref 4.8–5.6)

## 2022-04-17 NOTE — Assessment & Plan Note (Signed)
Chronic, stable  Currently taking Amlodipine 10 mg PO QD, Losartan 50 mg PO Qdand appears to be tolerating well Continue current regimen Encouraged diet and exercise for further management Follow up in 6 months or sooner if concerns arise

## 2022-04-17 NOTE — Assessment & Plan Note (Signed)
Chronic, stable and well managed at this time with Prilosec Discussed trying Pepcid if he wishes to reduce Prilosec use and can use PRN medications as needed He voiced understanding and stated he would consider trial change if tolerable Follow up every 6 months or as needed for concerns

## 2022-04-17 NOTE — Assessment & Plan Note (Signed)
Chronic, stable Appears well controlled on Rosuvastatin 10 mg PO QD  Continue current regimen Recheck lipid panel today- results to dictate further management Follow up in 6 months for monitoring

## 2022-05-10 ENCOUNTER — Encounter: Payer: Self-pay | Admitting: Family Medicine

## 2022-08-14 ENCOUNTER — Encounter: Payer: Self-pay | Admitting: Nurse Practitioner

## 2022-08-14 ENCOUNTER — Telehealth (INDEPENDENT_AMBULATORY_CARE_PROVIDER_SITE_OTHER): Payer: BC Managed Care – PPO | Admitting: Nurse Practitioner

## 2022-08-14 ENCOUNTER — Ambulatory Visit: Payer: Self-pay | Admitting: *Deleted

## 2022-08-14 DIAGNOSIS — K5792 Diverticulitis of intestine, part unspecified, without perforation or abscess without bleeding: Secondary | ICD-10-CM

## 2022-08-14 MED ORDER — AMOXICILLIN-POT CLAVULANATE 875-125 MG PO TABS: 1.0000 | ORAL_TABLET | Freq: Two times a day (BID) | ORAL | 0 refills | Status: DC

## 2022-08-14 NOTE — Telephone Encounter (Signed)
Reason for Disposition  [1] MODERATE pain (e.g., interferes with normal activities) AND [2] pain comes and goes (cramps) AND [3] present > 24 hours  (Exception: Pain with Vomiting or Diarrhea - see that Guideline.)  Answer Assessment - Initial Assessment Questions 1. LOCATION: "Where does it hurt?"      I have diverticulitis that flares up occasionally.  It gets infection and swells up.  I get diarrhea.   Sometimes I need an antibiotic to clear it up.   I've had it 12-15 yrs.    It's hurting on left side. 2. RADIATION: "Does the pain shoot anywhere else?" (e.g., chest, back)     Going behind the hip bone and into my groin.  I have nausea.   No vomiting.   Just diarrhea 3. ONSET: "When did the pain begin?" (Minutes, hours or days ago)      It's been going on a little over a week.   Sometimes it will clear up on its own but not this time. 4. SUDDEN: "Gradual or sudden onset?"     Not asked 5. PATTERN "Does the pain come and go, or is it constant?"    - If it comes and goes: "How long does it last?" "Do you have pain now?"     (Note: Comes and goes means the pain is intermittent. It goes away completely between bouts.)    - If constant: "Is it getting better, staying the same, or getting worse?"      (Note: Constant means the pain never goes away completely; most serious pain is constant and gets worse.)      Constant 6. SEVERITY: "How bad is the pain?"  (e.g., Scale 1-10; mild, moderate, or severe)    - MILD (1-3): Doesn't interfere with normal activities, abdomen soft and not tender to touch.     - MODERATE (4-7): Interferes with normal activities or awakens from sleep, abdomen tender to touch.     - SEVERE (8-10): Excruciating pain, doubled over, unable to do any normal activities.       Moderate 7. RECURRENT SYMPTOM: "Have you ever had this type of stomach pain before?" If Yes, ask: "When was the last time?" and "What happened that time?"      Yes many times 8. CAUSE: "What do you think is  causing the stomach pain?"     Diverticulitis   9. RELIEVING/AGGRAVATING FACTORS: "What makes it better or worse?" (e.g., antacids, bending or twisting motion, bowel movement)     Nothing so far. 10. OTHER SYMPTOMS: "Do you have any other symptoms?" (e.g., back pain, diarrhea, fever, urination pain, vomiting)       Diarrhea, abd pain.  Protocols used: Abdominal Pain - Male-A-AH

## 2022-08-14 NOTE — Assessment & Plan Note (Signed)
Acute for over one week, attempted diet changes at home without benefit.  Is to attend child's graduation this weekend.  Has been treated in past, last was 2 years ago.  Colonoscopy up to date.  Will start Augmentin BID for 10 days.  Recommend he focus on diet changes heavily, avoiding seeds and nuts.  No Ibuprofen, Tylenol only for pain.  Return in 10 days for follow-up.

## 2022-08-14 NOTE — Telephone Encounter (Signed)
  Chief Complaint: Diverticulitis flare up Symptoms: Pain in left side of abd radiating behind my hip bone and into my groin area.   I've had this many times.   They prescribe me an antibiotic for it.  Having diarrhea Frequency: Started about a week ago. Pertinent Negatives: Patient denies vomiting.   Disposition: [] ED /[] Urgent Care (no appt availability in office) / [x] Appointment(In office/virtual)/ []  [facility]/ [] Home Care/ [] Refused Recommended Disposition /[] [facility]/ []  Follow-up with PCP Additional Notes: A virtual visit as requested by pt was scheduled with Aura Dials, NP for today at 2:00.  No appts available with Dr. Laural Benes today.  He is at work and does not have access to a computer so he said that they always call him instead of doing the video part when he is at work, which he is today.

## 2022-08-14 NOTE — Progress Notes (Signed)
There were no vitals taken for this visit.   Subjective:    Patient ID: Ethan Snyder, male    DOB: 03-12-1966, 57 y.o.   MRN: [id number]  HPI: Ethan Snyder is a 57 y.o. male  Chief Complaint  Patient presents with   Diverticulitis flare    Started about week ago, having swelling, diarrhea, abx cramps   This visit was completed via video visit through MyChart due to the restrictions of the COVID-19 pandemic. All issues as above were discussed and addressed. Physical exam was done as above through visual confirmation on video through MyChart. If it was felt that the patient should be evaluated in the office, they were directed there. The patient verbally consented to this visit. Location of the patient: home Location of the provider: work Those involved with this call:  Provider: Aura Dials, DNP CMA: Tristan Schroeder, CMA Front Desk/Registration: Ozella Almond  Time spent on call:  21 minutes with patient face to face via video conference. More than 50% of this time was spent in counseling and coordination of care. 15 minutes total spent in review of patient's record and preparation of their chart.  I verified patient identity using two factors (patient name and date of birth). Patient consents verbally to being seen via telemedicine visit today.    ABDOMINAL PAIN  Has been having symptoms for 1-2 weeks.  Having swelling in abdomen, diarrhea, and cramping to LLQ.  Last flare July 2022.  No recent diet changes -- tries to keep seeds and nuts low.  Has tried not eating for a few days, often this works, but this is not working.  Had colonoscopy last 12/22/21. Duration:weeks Onset: sudden Severity: 3/10 Quality: dull, aching, and cramping Location:  LLQ  Episode duration: over one week Radiation: no Frequency: constant Alleviating factors: nothing, tried diet changes Aggravating factors: nothing Status: worse Treatments attempted: diet changes Fever: no -  night sweats a couple nights Nausea: yes Vomiting: no Weight loss: no Decreased appetite: yes Diarrhea: yes Constipation: no Blood in stool: no Heartburn: no Jaundice: no Rash: no Dysuria/urinary frequency: no Hematuria: no Recurrent NSAID use: no   Relevant past medical, surgical, family and social history reviewed and updated as indicated. Interim medical history since our last visit reviewed. Allergies and medications reviewed and updated.  Review of Systems  Constitutional:  Positive for chills and fatigue. Negative for activity change, appetite change, diaphoresis and fever.  Respiratory:  Negative for cough, chest tightness, shortness of breath and wheezing.   Cardiovascular:  Negative for chest pain, palpitations and leg swelling.  Gastrointestinal:  Positive for abdominal distention, abdominal pain, diarrhea and nausea. Negative for anal bleeding, blood in stool, constipation, rectal pain and vomiting.  Neurological: Negative.   Psychiatric/Behavioral: Negative.      Per HPI unless specifically indicated above     Objective:    There were no vitals taken for this visit.  Wt Readings from Last 3 Encounters:  04/14/22 209 lb 11.2 oz (95.1 kg)  03/30/22 200 lb (90.7 kg)  12/22/21 203 lb 11.2 oz (92.4 kg)    Physical Exam Vitals and nursing note reviewed.  Constitutional:      General: He is awake. He is not in acute distress.    Appearance: He is well-developed and well-groomed. He is obese. He is not ill-appearing.  HENT:     Head: Normocephalic.     Right Ear: Hearing normal. No drainage.     Left Ear: Hearing normal. No  drainage.  Eyes:     General: Lids are normal.        Right eye: No discharge.        Left eye: No discharge.     Conjunctiva/sclera: Conjunctivae normal.  Pulmonary:     Effort: Pulmonary effort is normal. No accessory muscle usage or respiratory distress.  Abdominal:     Tenderness: There is abdominal tenderness in the left lower  quadrant.     Comments: On patient palpitation to LLQ he has discomfort.  Musculoskeletal:     Cervical back: Normal range of motion.  Neurological:     Mental Status: He is alert and oriented to person, place, and time.  Psychiatric:        Mood and Affect: Mood normal.        Behavior: Behavior normal. Behavior is cooperative.        Thought Content: Thought content normal.        Judgment: Judgment normal.     Results for orders placed or performed in visit on 04/14/22  Lipid Profile  Result Value Ref Range   Cholesterol, Total 144 100 - 199 mg/dL   Triglycerides 68 0 - 149 mg/dL   HDL 56 >16 mg/dL   VLDL Cholesterol Cal 14 5 - 40 mg/dL   LDL Chol Calc (NIH) 74 0 - 99 mg/dL   Chol/HDL Ratio 2.6 0.0 - 5.0 ratio  CBC w/Diff  Result Value Ref Range   WBC 6.5 3.4 - 10.8 x10E3/uL   RBC 5.72 4.14 - 5.80 x10E6/uL   Hemoglobin 16.1 13.0 - 17.7 g/dL   Hematocrit 10.9 60.4 - 51.0 %   MCV 83 79 - 97 fL   MCH 28.1 26.6 - 33.0 pg   MCHC 33.8 31.5 - 35.7 g/dL   RDW 54.0 98.1 - 19.1 %   Platelets 241 150 - 450 x10E3/uL   Neutrophils 66 Not Estab. %   Lymphs 23 Not Estab. %   Monocytes 10 Not Estab. %   Eos 1 Not Estab. %   Basos 0 Not Estab. %   Neutrophils Absolute 4.3 1.4 - 7.0 x10E3/uL   Lymphocytes Absolute 1.5 0.7 - 3.1 x10E3/uL   Monocytes Absolute 0.6 0.1 - 0.9 x10E3/uL   EOS (ABSOLUTE) 0.1 0.0 - 0.4 x10E3/uL   Basophils Absolute 0.0 0.0 - 0.2 x10E3/uL   Immature Granulocytes 0 Not Estab. %   Immature Grans (Abs) 0.0 0.0 - 0.1 x10E3/uL  Comp Met (CMET)  Result Value Ref Range   Glucose 96 70 - 99 mg/dL   BUN 13 6 - 24 mg/dL   Creatinine, Ser 4.78 0.76 - 1.27 mg/dL   eGFR 77 >29 FA/OZH/0.86   BUN/Creatinine Ratio 12 9 - 20   Sodium 140 134 - 144 mmol/L   Potassium 4.5 3.5 - 5.2 mmol/L   Chloride 102 96 - 106 mmol/L   CO2 27 20 - 29 mmol/L   Calcium 9.2 8.7 - 10.2 mg/dL   Total Protein 6.8 6.0 - 8.5 g/dL   Albumin 4.7 3.8 - 4.9 g/dL   Globulin, Total 2.1 1.5 - 4.5  g/dL   Albumin/Globulin Ratio 2.2 1.2 - 2.2   Bilirubin Total 0.5 0.0 - 1.2 mg/dL   Alkaline Phosphatase 76 44 - 121 IU/L   AST 25 0 - 40 IU/L   ALT 27 0 - 44 IU/L  HgB A1c  Result Value Ref Range   Hgb A1c MFr Bld 5.7 (H) 4.8 - 5.6 %   Est.  average glucose Bld gHb Est-mCnc 117 mg/dL      Assessment & Plan:   Problem List Items Addressed This Visit       Other   Diverticulitis - Primary    Acute for over one week, attempted diet changes at home without benefit.  Is to attend child's graduation this weekend.  Has been treated in past, last was 2 years ago.  Colonoscopy up to date.  Will start Augmentin BID for 10 days.  Recommend he focus on diet changes heavily, avoiding seeds and nuts.  No Ibuprofen, Tylenol only for pain.  Return in 10 days for follow-up.       I discussed the assessment and treatment plan with the patient. The patient was provided an opportunity to ask questions and all were answered. The patient agreed with the plan and demonstrated an understanding of the instructions.   The patient was advised to call back or seek an in-person evaluation if the symptoms worsen or if the condition fails to improve as anticipated.   I provided 21+ minutes of time during this encounter.    Follow up plan: Return in about 10 days (around 08/24/2022) for Diverticulitis.

## 2022-08-14 NOTE — Patient Instructions (Signed)
Diverticulitis  Diverticulitis is when small pouches in your colon get infected or swollen. This causes pain in your belly (abdomen) and watery poop (diarrhea). The small pouches are called diverticula. They may form if you have a condition called diverticulosis. What are the causes? You may get this condition if poop (stool) gets trapped in the pouches in your colon. The poop lets germs (bacteria) grow. This causes an infection. What increases the risk? You are more likely to get this condition if you have small pouches in your colon. You are also more likely to get it if: You are overweight or very overweight (obese). You do not exercise enough. You drink alcohol. You smoke. You eat a lot of red meat, like beef, pork, or lamb. You do not eat enough fiber. You are older than 58 years of age. What are the signs or symptoms? Pain in your belly. Pain is often on the left side, but it may be felt in other spots too. Fever and chills. Feeling like you may vomit. Vomiting. Having cramps. Feeling full. Changes in how often you poop. Blood in your poop. How is this treated? Most cases are treated at home. You may be told to: Take over-the-counter pain medicines. Only eat and drink clear liquids. Take antibiotics. Rest. Very bad cases may need to be treated at a hospital. Treatment may include: Not eating or drinking. Taking pain medicines. Getting antibiotics through an IV tube. Getting fluid and food through an IV tube. Having surgery. When you are feeling better, you may need to have a test to look at your colon (colonoscopy). Follow these instructions at home: Medicines Take over-the-counter and prescription medicines only as told by your doctor. These include: Fiber pills. Probiotics. Medicines to make your poop soft (stool softeners). If you were prescribed antibiotics, take them as told by your doctor. Do not stop taking them even if you start to feel better. Ask your  doctor if you should avoid driving or using machines while you are taking your medicine. Eating and drinking  Follow the diet told by your doctor. You may need to only eat and drink liquids. When you feel better, you may be able to eat more foods. You may also be told to eat a lot of fiber. Fiber helps you poop. Foods with fiber include berries, beans, lentils, and green vegetables. Try not to eat red meat. General instructions Do not smoke or use any products that contain nicotine or tobacco. If you need help quitting, ask your doctor. Exercise 3 or more times a week. Try to go for 30 minutes each time. Exercise enough to sweat and make your heart beat faster. Contact a doctor if: Your pain gets worse. You are not pooping like normal. Your symptoms do not get better. Your symptoms get worse very fast. You have a fever. You vomit more than one time. You have poop that is: Bloody. Black. Tarry. This information is not intended to replace advice given to you by your health care provider. Make sure you discuss any questions you have with your health care provider. Document Revised: 12/22/2021 Document Reviewed: 12/22/2021 Elsevier Patient Education  2023 Elsevier Inc.  

## 2022-08-14 NOTE — Progress Notes (Signed)
Appointment has been made

## 2022-08-15 ENCOUNTER — Other Ambulatory Visit: Payer: Self-pay | Admitting: Family Medicine

## 2022-08-15 MED ORDER — AMOXICILLIN-POT CLAVULANATE 875-125 MG PO TABS: 1.0000 | ORAL_TABLET | Freq: Two times a day (BID) | ORAL | 0 refills | Status: AC

## 2022-08-15 NOTE — Telephone Encounter (Signed)
Pt requests that the Rx for amoxicillin-clavulanate (AUGMENTIN) 875-125 MG tablet  be sent to  [id number], [place] - [address] AT [facility] [address] [facility] [address] Phone: [phone]  Fax: [phone]

## 2022-08-15 NOTE — Telephone Encounter (Signed)
Change of pharmacy per pt request  Requested Prescriptions  Pending Prescriptions Disp Refills   amoxicillin-clavulanate (AUGMENTIN) 875-125 MG tablet 20 tablet 0    Sig: Take 1 tablet by mouth 2 (two) times daily for 10 days.     Off-Protocol Failed - 08/15/2022 12:10 PM      Failed - Medication not assigned to a protocol, review manually.      Passed - Valid encounter within last 12 months    Recent Outpatient Visits           Yesterday Diverticulitis   [place] [facility] [place], [place] T, NP   4 months ago Other hyperlipidemia   [place] [facility] Mecum, Oswaldo Conroy, PA-C   10 months ago Primary hypertension   [place] [facility] Gabriel Cirri, NP   1 year ago Viral upper respiratory tract infection   [place] [facility] Vigg, Avanti, MD   1 year ago Aortic atherosclerosis [facility])   [place] [facility] Loura Pardon, MD       Future Appointments             In 1 week Dorcas Carrow, DO [place] [facility], [facility]   In 2 months Laural Benes, Oralia Rud, DO [facility], [facility]

## 2022-08-24 ENCOUNTER — Ambulatory Visit: Payer: BC Managed Care – PPO | Admitting: Family Medicine

## 2022-08-24 ENCOUNTER — Encounter: Payer: Self-pay | Admitting: Family Medicine

## 2022-08-24 VITALS — BP 124/73 | HR 67 | Temp 98.2°F | Ht 71.5 in | Wt 206.5 lb

## 2022-08-24 DIAGNOSIS — K580 Irritable bowel syndrome with diarrhea: Secondary | ICD-10-CM

## 2022-08-24 DIAGNOSIS — K5792 Diverticulitis of intestine, part unspecified, without perforation or abscess without bleeding: Secondary | ICD-10-CM | POA: Diagnosis not present

## 2022-08-24 LAB — CBC WITH DIFFERENTIAL/PLATELET
Hematocrit: 47.5 % (ref 37.5–51.0)
Hemoglobin: 16.5 g/dL (ref 13.0–17.7)
Lymphocytes Absolute: 2.4 10*3/uL (ref 0.7–3.1)
Lymphs: 32 %
MCH: 28.5 pg (ref 26.6–33.0)
MCHC: 34.7 g/dL (ref 31.5–35.7)
MCV: 82 fL (ref 79–97)
MID (Absolute): 0.6 10*3/uL (ref 0.1–1.6)
MID: 8 %
Neutrophils Absolute: 4.6 10*3/uL (ref 1.4–7.0)
Neutrophils: 61 %
Platelets: 171 10*3/uL (ref 150–450)
RBC: 5.78 x10E6/uL (ref 4.14–5.80)
RDW: 14.8 % (ref 11.6–15.4)
WBC: 7.6 10*3/uL (ref 3.4–10.8)

## 2022-08-24 MED ORDER — RIFAXIMIN 200 MG PO TABS: 200.0000 mg | ORAL_TABLET | Freq: Three times a day (TID) | ORAL | 0 refills | Status: DC

## 2022-08-24 NOTE — Assessment & Plan Note (Signed)
Chronic x15 years. Has had full work up with GI in the past. Will try him on xifaxin. Recheck at follow up in July. Call with any concerns.

## 2022-08-24 NOTE — Progress Notes (Signed)
BP 124/73   Pulse 67   Temp 98.2 F (36.8 C) (Oral)   Ht 5' 11.5" (1.816 m)   Wt 206 lb 8 oz (93.7 kg)   SpO2 97%   BMI 28.40 kg/m    Subjective:    Patient ID: Ethan Snyder, male    DOB: 1965/08/14, 57 y.o.   MRN: [id number]  HPI: Ethan Snyder is a 57 y.o. male  Chief Complaint  Patient presents with   Diverticulitis    Patient says the antibiotics is working "some" and says his diagnosis is an every day thing and now just a flare up type of diagnosis. Patient says he feels the swelling and infection is going down or away, but his other symptoms are lingering around.    ABDOMINAL ISSUES Duration: chronic- 15 years Nature: cramping  Location: LLQ  Severity: severe  Radiation: no Episode duration: 15 years Frequency: intermittent Treatments attempted: antacids, PPI, and laxatives Constipation: no Diarrhea: yes Episodes of diarrhea/day: Mucous in the stool: no Heartburn: no Bloating:yes Flatulence: yes Nausea: no Vomiting: no Episodes of vomit/day: Melena or hematochezia: no Rash: no Jaundice: no Fever: no Weight loss: no  Relevant past medical, surgical, family and social history reviewed and updated as indicated. Interim medical history since our last visit reviewed. Allergies and medications reviewed and updated.  Review of Systems  Constitutional: Negative.   Respiratory: Negative.    Cardiovascular: Negative.   Gastrointestinal:  Positive for abdominal pain and diarrhea. Negative for abdominal distention, anal bleeding, blood in stool, constipation, nausea, rectal pain and vomiting.  Musculoskeletal: Negative.   Psychiatric/Behavioral: Negative.      Per HPI unless specifically indicated above     Objective:    BP 124/73   Pulse 67   Temp 98.2 F (36.8 C) (Oral)   Ht 5' 11.5" (1.816 m)   Wt 206 lb 8 oz (93.7 kg)   SpO2 97%   BMI 28.40 kg/m   Wt Readings from Last 3 Encounters:  08/24/22 206 lb 8 oz (93.7 kg)   04/14/22 209 lb 11.2 oz (95.1 kg)  03/30/22 200 lb (90.7 kg)    Physical Exam Vitals and nursing note reviewed.  Constitutional:      General: He is not in acute distress.    Appearance: Normal appearance. He is not ill-appearing, toxic-appearing or diaphoretic.  HENT:     Head: Normocephalic and atraumatic.     Right Ear: External ear normal.     Left Ear: External ear normal.     Nose: Nose normal.     Mouth/Throat:     Mouth: Mucous membranes are moist.     Pharynx: Oropharynx is clear.  Eyes:     General: No scleral icterus.       Right eye: No discharge.        Left eye: No discharge.     Extraocular Movements: Extraocular movements intact.     Conjunctiva/sclera: Conjunctivae normal.     Pupils: Pupils are equal, round, and reactive to light.  Cardiovascular:     Rate and Rhythm: Normal rate and regular rhythm.     Pulses: Normal pulses.     Heart sounds: Normal heart sounds. No murmur heard.    No friction rub. No gallop.  Pulmonary:     Effort: Pulmonary effort is normal. No respiratory distress.     Breath sounds: Normal breath sounds. No stridor. No wheezing, rhonchi or rales.  Chest:     Chest wall: No  tenderness.  Abdominal:     General: Abdomen is flat. Bowel sounds are normal. There is no distension.     Palpations: Abdomen is soft. There is no mass.     Tenderness: There is no abdominal tenderness. There is no right CVA tenderness, left CVA tenderness, guarding or rebound.     Hernia: No hernia is present.  Musculoskeletal:        General: Normal range of motion.     Cervical back: Normal range of motion and neck supple.  Skin:    General: Skin is warm and dry.     Capillary Refill: Capillary refill takes less than 2 seconds.     Coloration: Skin is not jaundiced or pale.     Findings: No bruising, erythema, lesion or rash.  Neurological:     General: No focal deficit present.     Mental Status: He is alert and oriented to person, place, and time.  Mental status is at baseline.  Psychiatric:        Mood and Affect: Mood normal.        Behavior: Behavior normal.        Thought Content: Thought content normal.        Judgment: Judgment normal.     Results for orders placed or performed in visit on 08/24/22  HM HEPATITIS C SCREENING LAB  Result Value Ref Range   HM Hepatitis Screen Negative-Validated   CBC With Differential/Platelet  Result Value Ref Range   WBC 7.6 3.4 - 10.8 x10E3/uL   RBC 5.78 4.14 - 5.80 x10E6/uL   Hemoglobin 16.5 13.0 - 17.7 g/dL   Hematocrit 16.1 09.6 - 51.0 %   MCV 82 79 - 97 fL   MCH 28.5 26.6 - 33.0 pg   MCHC 34.7 31.5 - 35.7 g/dL   RDW 04.5 40.9 - 81.1 %   Platelets 171 150 - 450 x10E3/uL   Neutrophils 61 Not Estab. %   Lymphs 32 Not Estab. %   MID 8 Not Estab. %   Neutrophils Absolute 4.6 1.4 - 7.0 x10E3/uL   Lymphocytes Absolute 2.4 0.7 - 3.1 x10E3/uL   MID (Absolute) 0.6 0.1 - 1.6 X10E3/uL      Assessment & Plan:   Problem List Items Addressed This Visit       Digestive   Irritable bowel syndrome with diarrhea    Chronic x15 years. Has had full work up with GI in the past. Will try him on xifaxin. Recheck at follow up in July. Call with any concerns.         Other   Diverticulitis - Primary    WBC normal. Symptoms have resolved. Finish abx. Call with any concerns. Continue to monitor.       Relevant Orders   CBC With Differential/Platelet (Completed)     Follow up plan: Return As scheduled.

## 2022-08-24 NOTE — Assessment & Plan Note (Signed)
WBC normal. Symptoms have resolved. Finish abx. Call with any concerns. Continue to monitor.

## 2022-10-13 ENCOUNTER — Other Ambulatory Visit: Payer: Self-pay | Admitting: Unknown Physician Specialty

## 2022-10-13 NOTE — Telephone Encounter (Signed)
Requested Prescriptions  Pending Prescriptions Disp Refills   amLODipine (NORVASC) 10 MG tablet [Pharmacy Med Name: AMLODIPINE BESYLATE 10MG  TABLETS] 90 tablet 0    Sig: TAKE 1 TABLET(10 MG) BY MOUTH DAILY     Cardiovascular: Calcium Channel Blockers 2 Passed - 10/13/2022 10:58 AM      Passed - Last BP in normal range    BP Readings from Last 1 Encounters:  08/24/22 124/73         Passed - Last Heart Rate in normal range    Pulse Readings from Last 1 Encounters:  08/24/22 67         Passed - Valid encounter within last 6 months    Recent Outpatient Visits           1 month ago Diverticulitis   [place] [facility] [place], Megan P, DO   2 months ago Diverticulitis   [place] [facility] [place], Corrie Dandy T, NP   6 months ago Other hyperlipidemia   [place] [facility] Mecum, Oswaldo Conroy, PA-C   1 year ago Primary hypertension   [place] [facility] Gabriel Cirri, NP   1 year ago Viral upper respiratory tract infection   [place] [facility] Vigg, Avanti, MD       Future Appointments             In 1 week Johnson, Oralia Rud, DO [place] [facility], [facility]             losartan (COZAAR) 50 MG tablet [Pharmacy Med Name: LOSARTAN 50MG  TABLETS] 90 tablet 0    Sig: TAKE 1 TABLET(50 MG) BY MOUTH DAILY     Cardiovascular:  Angiotensin Receptor Blockers Failed - 10/13/2022 10:58 AM      Failed - Cr in normal range and within 180 days    Creatinine, Ser  Date Value Ref Range Status  04/14/2022 1.12 0.76 - 1.27 mg/dL Final         Failed - K in normal range and within 180 days    Potassium  Date Value Ref Range Status  04/14/2022 4.5 3.5 - 5.2 mmol/L Final         Passed - Patient is not pregnant      Passed - Last BP in normal range    BP Readings from Last 1 Encounters:  08/24/22 124/73         Passed - Valid encounter within last 6 months    Recent Outpatient Visits            1 month ago Diverticulitis   [place] [facility] [place], Megan P, DO   2 months ago Diverticulitis   [place] [facility] [place], Corrie Dandy T, NP   6 months ago Other hyperlipidemia   [place] [facility] Mecum, Oswaldo Conroy, PA-C   1 year ago Primary hypertension   [place] [facility] Gabriel Cirri, NP   1 year ago Viral upper respiratory tract infection   [place] [facility] Vigg, Avanti, MD       Future Appointments             In 1 week Laural Benes, Oralia Rud, DO [place] [facility], [facility]

## 2022-10-16 ENCOUNTER — Ambulatory Visit: Payer: BC Managed Care – PPO | Admitting: Family Medicine

## 2022-10-23 ENCOUNTER — Ambulatory Visit: Payer: BC Managed Care – PPO | Admitting: Family Medicine

## 2022-11-17 ENCOUNTER — Other Ambulatory Visit: Payer: Self-pay | Admitting: Family Medicine

## 2022-11-20 NOTE — Telephone Encounter (Signed)
Refilled 10/13/22 # 90. Requested Prescriptions  Refused Prescriptions Disp Refills   losartan (COZAAR) 50 MG tablet [Pharmacy Med Name: LOSARTAN 50MG  TABLETS] 90 tablet 0    Sig: TAKE 1 TABLET(50 MG) BY MOUTH DAILY     Cardiovascular:  Angiotensin Receptor Blockers Failed - 11/17/2022  1:19 PM      Failed - Cr in normal range and within 180 days    Creatinine, Ser  Date Value Ref Range Status  04/14/2022 1.12 0.76 - 1.27 mg/dL Final         Failed - K in normal range and within 180 days    Potassium  Date Value Ref Range Status  04/14/2022 4.5 3.5 - 5.2 mmol/L Final         Passed - Patient is not pregnant      Passed - Last BP in normal range    BP Readings from Last 1 Encounters:  08/24/22 124/73         Passed - Valid encounter within last 6 months    Recent Outpatient Visits           2 months ago Diverticulitis   [place] [facility] [place], Megan P, DO   3 months ago Diverticulitis   [place] [facility] [place], Corrie Dandy T, NP   7 months ago Other hyperlipidemia   [place] [facility] Mecum, Oswaldo Conroy, PA-C   1 year ago Primary hypertension   [place] [facility] Gabriel Cirri, NP   1 year ago Viral upper respiratory tract infection   [place] [facility] Vigg, Avanti, MD       Future Appointments             In 1 week Laural Benes, Oralia Rud, DO [place] [facility], [facility]

## 2022-11-22 ENCOUNTER — Other Ambulatory Visit: Payer: Self-pay | Admitting: Family Medicine

## 2022-11-23 NOTE — Telephone Encounter (Signed)
Rx 10/13/22 #90- too soon Requested Prescriptions  Pending Prescriptions Disp Refills   losartan (COZAAR) 50 MG tablet [Pharmacy Med Name: LOSARTAN 50MG  TABLETS] 90 tablet 0    Sig: TAKE 1 TABLET(50 MG) BY MOUTH DAILY     Cardiovascular:  Angiotensin Receptor Blockers Failed - 11/22/2022 11:59 AM      Failed - Cr in normal range and within 180 days    Creatinine, Ser  Date Value Ref Range Status  04/14/2022 1.12 0.76 - 1.27 mg/dL Final         Failed - K in normal range and within 180 days    Potassium  Date Value Ref Range Status  04/14/2022 4.5 3.5 - 5.2 mmol/L Final         Passed - Patient is not pregnant      Passed - Last BP in normal range    BP Readings from Last 1 Encounters:  08/24/22 124/73         Passed - Valid encounter within last 6 months    Recent Outpatient Visits           3 months ago Diverticulitis   [place] [facility] [place], Megan P, DO   3 months ago Diverticulitis   [place] [facility] [place], Corrie Dandy T, NP   7 months ago Other hyperlipidemia   [place] [facility] Mecum, Oswaldo Conroy, PA-C   1 year ago Primary hypertension   [place] [facility] Gabriel Cirri, NP   1 year ago Viral upper respiratory tract infection   [place] [facility] Vigg, Avanti, MD       Future Appointments             In 5 days Dorcas Carrow, DO [place] [facility], [facility]

## 2022-11-28 ENCOUNTER — Ambulatory Visit: Payer: BC Managed Care – PPO | Admitting: Family Medicine

## 2022-11-28 ENCOUNTER — Encounter: Payer: Self-pay | Admitting: Family Medicine

## 2022-11-28 VITALS — BP 133/77 | HR 56 | Temp 97.6°F | Wt 201.6 lb

## 2022-11-28 DIAGNOSIS — K580 Irritable bowel syndrome with diarrhea: Secondary | ICD-10-CM

## 2022-11-28 DIAGNOSIS — L609 Nail disorder, unspecified: Secondary | ICD-10-CM | POA: Diagnosis not present

## 2022-11-28 DIAGNOSIS — I10 Essential (primary) hypertension: Secondary | ICD-10-CM | POA: Diagnosis not present

## 2022-11-28 DIAGNOSIS — Z0000 Encounter for general adult medical examination without abnormal findings: Secondary | ICD-10-CM | POA: Diagnosis not present

## 2022-11-28 DIAGNOSIS — Z1283 Encounter for screening for malignant neoplasm of skin: Secondary | ICD-10-CM

## 2022-11-28 DIAGNOSIS — E7849 Other hyperlipidemia: Secondary | ICD-10-CM | POA: Diagnosis not present

## 2022-11-28 DIAGNOSIS — I700 Atherosclerosis of aorta: Secondary | ICD-10-CM

## 2022-11-28 LAB — MICROALBUMIN, URINE WAIVED
Creatinine, Urine Waived: 50 mg/dL (ref 10–300)
Microalb, Ur Waived: 10 mg/L (ref 0–19)
Microalb/Creat Ratio: <30 mg/g (ref <30)

## 2022-11-28 MED ORDER — RIFAXIMIN 200 MG PO TABS: 200.0000 mg | ORAL_TABLET | Freq: Three times a day (TID) | ORAL | 0 refills | Status: AC

## 2022-11-28 MED ORDER — AMLODIPINE BESYLATE 10 MG PO TABS: 10.0000 mg | ORAL_TABLET | Freq: Every day | ORAL | 1 refills | Status: DC

## 2022-11-28 MED ORDER — ROSUVASTATIN CALCIUM 10 MG PO TABS: 10.0000 mg | ORAL_TABLET | Freq: Every day | ORAL | 1 refills | Status: DC

## 2022-11-28 MED ORDER — LOSARTAN POTASSIUM 50 MG PO TABS: 50.0000 mg | ORAL_TABLET | Freq: Every day | ORAL | 1 refills | Status: DC

## 2022-11-28 NOTE — Assessment & Plan Note (Signed)
Was not able to get his xifaxin. Will try it again. Call with any concerns. Recheck at 6 month follow up.

## 2022-11-28 NOTE — Progress Notes (Signed)
BP 133/77   Pulse (!) 56   Temp 97.6 F (36.4 C) (Oral)   Wt 201 lb 9.6 oz (91.4 kg)   SpO2 98%   BMI 27.73 kg/m    Subjective:    Patient ID: Ethan Snyder, male    DOB: Dec 06, 1965, 57 y.o.   MRN: [id number]  HPI: Bobie Ciccarello is a 57 y.o. male presenting on 11/28/2022 for comprehensive medical examination. Current medical complaints include:  HYPERTENSION / HYPERLIPIDEMIA Satisfied with current treatment? yes Duration of hypertension: chronic BP monitoring frequency: not checking BP medication side effects: no Past BP meds: amlodipine, losartan Duration of hyperlipidemia: chronic Cholesterol medication side effects: no Cholesterol supplements: none Past cholesterol medications: crestor Medication compliance: excellent compliance Aspirin: no Recent stressors: no Recurrent headaches: no Visual changes: no Palpitations: no Dyspnea: no Chest pain: no Lower extremity edema: no Dizzy/lightheaded: no  Was not able to get the xifaxin due to supply issues. Diarrhea has not changed. He also notes that he has been having problems with the nail of his R index finger for a few weeks now. Doesn't remember hurting it. Unsure what caused it. No other concerns or complaints at this time.   Interim Problems from his last visit: no  Depression Screen done today and results listed below:     11/28/2022   11:20 AM 08/24/2022    9:29 AM 08/14/2022    2:09 PM 04/14/2022    9:12 AM 10/12/2021    9:48 AM  Depression screen PHQ 2/9  Decreased Interest 1 1 0 1 1  Down, Depressed, Hopeless 0 1 0 0 1  PHQ - 2 Score 1 2 0 1 2  Altered sleeping 1 1 1 1 2   Tired, decreased energy 2 1 1 1 3   Change in appetite 1 2 2  0 0  Feeling bad or failure about yourself  0 0 0 0 1  Trouble concentrating 0 1 0 0 0  Moving slowly or fidgety/restless 0 0 0 0 1  Suicidal thoughts 0 0 0 0 0  PHQ-9 Score 5 7 4 3 9   Difficult doing work/chores Not difficult at all Somewhat difficult Not  difficult at all  Very difficult    Past Medical History:  Past Medical History:  Diagnosis Date   DDD (degenerative disc disease), lumbar    GERD (gastroesophageal reflux disease)    Hypertension     Surgical History:  Past Surgical History:  Procedure Laterality Date   COLONOSCOPY W/ POLYPECTOMY     approx 2012/2013   COLONOSCOPY WITH PROPOFOL N/A 12/22/2021   Procedure: COLONOSCOPY WITH PROPOFOL;  Surgeon: Midge Minium, MD;  Location: [facility];  Service: Endoscopy;  Laterality: N/A;   HERNIA REPAIR     POLYPECTOMY N/A 12/22/2021   Procedure: POLYPECTOMY;  Surgeon: Midge Minium, MD;  Location: [facility];  Service: Endoscopy;  Laterality: N/A;    Medications:  Current Outpatient Medications on File Prior to Visit  Medication Sig   omeprazole (PRILOSEC OTC) 20 MG tablet Take 1 tablet by mouth daily.   No current facility-administered medications on file prior to visit.    Allergies:  Allergies  Allergen Reactions   Banana Itching, Swelling and Other (See Comments)    Feels tingling all over   Bee Venom Swelling   Lisinopril     Other reaction(s): Cough   Ibuprofen     Other reaction(s): Other (See Comments) Other Reaction: GI UPSET    Social History:  Social History  Socioeconomic History   Marital status: Single    Spouse name: Not on file   Number of children: Not on file   Years of education: Not on file   Highest education level: Some college, no degree  Occupational History   Not on file  Tobacco Use   Smoking status: Every Day    Current packs/day: 1.00    Average packs/day: 1 pack/day for 40.0 years (40.0 ttl pk-yrs)    Types: Cigarettes    Passive exposure: Current   Smokeless tobacco: Never  Vaping Use   Vaping status: Never Used  Substance and Sexual Activity   Alcohol use: Yes    Alcohol/week: 12.0 standard drinks of alcohol    Types: 12 Cans of beer per week   Drug use: Never   Sexual activity: Not Currently  Other  Topics Concern   Not on file  Social History Narrative   Not on file   Social Determinants of Health   Financial Resource Strain: Medium Risk (08/21/2022)   Overall Financial Resource Strain (CARDIA)    Difficulty of Paying Living Expenses: Somewhat hard  Food Insecurity: No Food Insecurity (08/21/2022)   Hunger Vital Sign    Worried About Running Out of Food in the Last Year: Never true    Ran Out of Food in the Last Year: Never true  Transportation Needs: No Transportation Needs (08/21/2022)   PRAPARE - Administrator, Civil Service (Medical): No    Lack of Transportation (Non-Medical): No  Physical Activity: Sufficiently Active (08/21/2022)   Exercise Vital Sign    Days of Exercise per Week: 5 days    Minutes of Exercise per Session: 30 min  Stress: Stress Concern Present (08/21/2022)   [employer] of Occupational Health - Occupational Stress Questionnaire    Feeling of Stress : To some extent  Social Connections: Not on file  Intimate Partner Violence: Not on file   Social History   Tobacco Use  Smoking Status Every Day   Current packs/day: 1.00   Average packs/day: 1 pack/day for 40.0 years (40.0 ttl pk-yrs)   Types: Cigarettes   Passive exposure: Current  Smokeless Tobacco Never   Social History   Substance and Sexual Activity  Alcohol Use Yes   Alcohol/week: 12.0 standard drinks of alcohol   Types: 12 Cans of beer per week    Family History:  Family History  Problem Relation Age of Onset   Healthy Mother    Lung cancer Father    Heart disease Brother     Past medical history, surgical history, medications, allergies, family history and social history reviewed with patient today and changes made to appropriate areas of the chart.   Review of Systems  Constitutional: Negative.   HENT: Negative.    Eyes: Negative.   Respiratory:  Positive for cough. Negative for hemoptysis, sputum production, shortness of breath and wheezing.    Cardiovascular:  Positive for leg swelling. Negative for chest pain, palpitations, orthopnea, claudication and PND.  Gastrointestinal:  Positive for diarrhea. Negative for abdominal pain, blood in stool, constipation, heartburn, melena, nausea and vomiting.  Genitourinary: Negative.   Musculoskeletal: Negative.   Skin: Negative.   Neurological: Negative.   Endo/Heme/Allergies:  Negative for environmental allergies and polydipsia. Bruises/bleeds easily.  Psychiatric/Behavioral: Negative.     All other ROS negative except what is listed above and in the HPI.      Objective:    BP 133/77   Pulse (!) 56   Temp  97.6 F (36.4 C) (Oral)   Wt 201 lb 9.6 oz (91.4 kg)   SpO2 98%   BMI 27.73 kg/m   Wt Readings from Last 3 Encounters:  11/28/22 201 lb 9.6 oz (91.4 kg)  08/24/22 206 lb 8 oz (93.7 kg)  04/14/22 209 lb 11.2 oz (95.1 kg)    Physical Exam Vitals and nursing note reviewed.  Constitutional:      General: He is not in acute distress.    Appearance: Normal appearance. He is obese. He is not ill-appearing, toxic-appearing or diaphoretic.  HENT:     Head: Normocephalic and atraumatic.     Right Ear: Tympanic membrane, ear canal and external ear normal. There is no impacted cerumen.     Left Ear: Tympanic membrane, ear canal and external ear normal. There is no impacted cerumen.     Nose: Nose normal. No congestion or rhinorrhea.     Mouth/Throat:     Mouth: Mucous membranes are moist.     Pharynx: Oropharynx is clear. No oropharyngeal exudate or posterior oropharyngeal erythema.  Eyes:     General: No scleral icterus.       Right eye: No discharge.        Left eye: No discharge.     Extraocular Movements: Extraocular movements intact.     Conjunctiva/sclera: Conjunctivae normal.     Pupils: Pupils are equal, round, and reactive to light.  Neck:     Vascular: No carotid bruit.  Cardiovascular:     Rate and Rhythm: Normal rate and regular rhythm.     Pulses: Normal  pulses.     Heart sounds: No murmur heard.    No friction rub. No gallop.  Pulmonary:     Effort: Pulmonary effort is normal. No respiratory distress.     Breath sounds: Normal breath sounds. No stridor. No wheezing, rhonchi or rales.  Chest:     Chest wall: No tenderness.  Abdominal:     General: Abdomen is flat. Bowel sounds are normal. There is no distension.     Palpations: Abdomen is soft. There is no mass.     Tenderness: There is no abdominal tenderness. There is no right CVA tenderness, left CVA tenderness, guarding or rebound.     Hernia: No hernia is present.  Genitourinary:    Comments: Genital exam deferred with shared decision making Musculoskeletal:        General: No swelling, tenderness, deformity or signs of injury.     Cervical back: Normal range of motion and neck supple. No rigidity. No muscular tenderness.     Right lower leg: No edema.     Left lower leg: No edema.  Lymphadenopathy:     Cervical: No cervical adenopathy.  Skin:    General: Skin is warm and dry.     Capillary Refill: Capillary refill takes less than 2 seconds.     Coloration: Skin is not jaundiced or pale.     Findings: No bruising, erythema, lesion or rash.     Comments: Thickened and broken nail on index finger of R hand  Neurological:     General: No focal deficit present.     Mental Status: He is alert and oriented to person, place, and time.     Cranial Nerves: No cranial nerve deficit.     Sensory: No sensory deficit.     Motor: No weakness.     Coordination: Coordination normal.     Gait: Gait normal.  Deep Tendon Reflexes: Reflexes normal.  Psychiatric:        Mood and Affect: Mood normal.        Behavior: Behavior normal.        Thought Content: Thought content normal.        Judgment: Judgment normal.     Results for orders placed or performed in visit on 08/24/22  HM HEPATITIS C SCREENING LAB  Result Value Ref Range   HM Hepatitis Screen Negative-Validated   CBC With  Differential/Platelet  Result Value Ref Range   WBC 7.6 3.4 - 10.8 x10E3/uL   RBC 5.78 4.14 - 5.80 x10E6/uL   Hemoglobin 16.5 13.0 - 17.7 g/dL   Hematocrit 78.2 95.6 - 51.0 %   MCV 82 79 - 97 fL   MCH 28.5 26.6 - 33.0 pg   MCHC 34.7 31.5 - 35.7 g/dL   RDW 21.3 08.6 - 57.8 %   Platelets 171 150 - 450 x10E3/uL   Neutrophils 61 Not Estab. %   Lymphs 32 Not Estab. %   MID 8 Not Estab. %   Neutrophils Absolute 4.6 1.4 - 7.0 x10E3/uL   Lymphocytes Absolute 2.4 0.7 - 3.1 x10E3/uL   MID (Absolute) 0.6 0.1 - 1.6 X10E3/uL      Assessment & Plan:   Problem List Items Addressed This Visit       Cardiovascular and Mediastinum   Primary hypertension    Under good control on current regimen. Continue current regimen. Continue to monitor. Call with any concerns. Refills given. Labs drawn today.        Relevant Medications   amLODipine (NORVASC) 10 MG tablet   losartan (COZAAR) 50 MG tablet   rosuvastatin (CRESTOR) 10 MG tablet   Other Relevant Orders   Microalbumin, Urine Waived   Aortic atherosclerosis (HCC)    Will keep BP and cholesterol under good control. Call with any concerns. Continue to monitor.       Relevant Medications   amLODipine (NORVASC) 10 MG tablet   losartan (COZAAR) 50 MG tablet   rosuvastatin (CRESTOR) 10 MG tablet     Digestive   Irritable bowel syndrome with diarrhea    Was not able to get his xifaxin. Will try it again. Call with any concerns. Recheck at 6 month follow up.         Other   Hyperlipidemia    Under good control on current regimen. Continue current regimen. Continue to monitor. Call with any concerns. Refills given. Labs drawn today.       Relevant Medications   amLODipine (NORVASC) 10 MG tablet   losartan (COZAAR) 50 MG tablet   rosuvastatin (CRESTOR) 10 MG tablet   Other Visit Diagnoses     Routine general medical examination at a health care facility    -  Primary   Vaccines up to date/declined. Screening labs checked today.  Colonoscopy up to date. Continue diet and exercise. Call with any concerns.   Relevant Orders   Comprehensive metabolic panel   CBC with Differential/Platelet   Lipid Panel w/o Chol/HDL Ratio   PSA   TSH   HIV Antibody (routine testing w rflx)   Hgb A1c w/o eAG   Nail problem       Referral to dermatology placed today. Call with any concerns.   Relevant Orders   Ambulatory referral to Dermatology   Screening for skin cancer       Referral to dermatology placed today. Call with any concerns.   Relevant  Orders   Ambulatory referral to Dermatology        LABORATORY TESTING:  Health maintenance labs ordered today as discussed above.   The natural history of prostate cancer and ongoing controversy regarding screening and potential treatment outcomes of prostate cancer has been discussed with the patient. The meaning of a false positive PSA and a false negative PSA has been discussed. He indicates understanding of the limitations of this screening test and wishes to proceed with screening PSA testing.   IMMUNIZATIONS:   - Tdap: Tetanus vaccination status reviewed: last tetanus booster within 10 years. - Influenza: Refused - Pneumovax: Not applicable - Prevnar: Not applicable - COVID: Up to date - HPV: Not applicable - Shingrix vaccine:  will think about it and may come in.  SCREENING: - Colonoscopy: Up to date  Discussed with patient purpose of the colonoscopy is to detect colon cancer at curable precancerous or early stages   PATIENT COUNSELING:    Sexuality: Discussed sexually transmitted diseases, partner selection, use of condoms, avoidance of unintended pregnancy  and contraceptive alternatives.   Advised to avoid cigarette smoking.  I discussed with the patient that most people either abstain from alcohol or drink within safe limits (<=14/week and <=4 drinks/occasion for males, <=7/weeks and <= 3 drinks/occasion for females) and that the risk for alcohol disorders and  other health effects rises proportionally with the number of drinks per week and how often a drinker exceeds daily limits.  Discussed cessation/primary prevention of drug use and availability of treatment for abuse.   Diet: Encouraged to adjust caloric intake to maintain  or achieve ideal body weight, to reduce intake of dietary saturated fat and total fat, to limit sodium intake by avoiding high sodium foods and not adding table salt, and to maintain adequate dietary potassium and calcium preferably from fresh fruits, vegetables, and low-fat dairy products.    stressed the importance of regular exercise  Injury prevention: Discussed safety belts, safety helmets, smoke detector, smoking near bedding or upholstery.   Dental health: Discussed importance of regular tooth brushing, flossing, and dental visits.   Follow up plan: NEXT PREVENTATIVE PHYSICAL DUE IN 1 YEAR. Return in about 6 months (around 05/31/2023).

## 2022-11-28 NOTE — Assessment & Plan Note (Signed)
Will keep BP and cholesterol under good control. Call with any concerns. Continue to monitor.  

## 2022-11-28 NOTE — Assessment & Plan Note (Signed)
Under good control on current regimen. Continue current regimen. Continue to monitor. Call with any concerns. Refills given. Labs drawn today.   

## 2022-11-29 LAB — CBC WITH DIFFERENTIAL/PLATELET
Basophils Absolute: 0 10*3/uL (ref 0.0–0.2)
Basos: 1 %
EOS (ABSOLUTE): 0 10*3/uL (ref 0.0–0.4)
Eos: 1 %
Hematocrit: 46.4 % (ref 37.5–51.0)
Hemoglobin: 15.2 g/dL (ref 13.0–17.7)
Immature Grans (Abs): 0 10*3/uL (ref 0.0–0.1)
Immature Granulocytes: 0 %
Lymphocytes Absolute: 1.4 10*3/uL (ref 0.7–3.1)
Lymphs: 24 %
MCH: 27.7 pg (ref 26.6–33.0)
MCHC: 32.8 g/dL (ref 31.5–35.7)
MCV: 85 fL (ref 79–97)
Monocytes Absolute: 0.5 10*3/uL (ref 0.1–0.9)
Monocytes: 8 %
Neutrophils Absolute: 3.9 10*3/uL (ref 1.4–7.0)
Neutrophils: 66 %
Platelets: 214 10*3/uL (ref 150–450)
RBC: 5.48 x10E6/uL (ref 4.14–5.80)
RDW: 13.1 % (ref 11.6–15.4)
WBC: 5.9 10*3/uL (ref 3.4–10.8)

## 2022-11-29 LAB — PSA: Prostate Specific Ag, Serum: 1.1 ng/mL (ref 0.0–4.0)

## 2022-11-29 LAB — LIPID PANEL W/O CHOL/HDL RATIO
Cholesterol, Total: 186 mg/dL (ref 100–199)
HDL: 60 mg/dL (ref >39)
LDL Chol Calc (NIH): 116 mg/dL — ABNORMAL HIGH (ref 0–99)
Triglycerides: 54 mg/dL (ref 0–149)
VLDL Cholesterol Cal: 10 mg/dL (ref 5–40)

## 2022-11-29 LAB — COMPREHENSIVE METABOLIC PANEL
ALT: 26 IU/L (ref 0–44)
AST: 29 IU/L (ref 0–40)
Albumin: 4.4 g/dL (ref 3.8–4.9)
Alkaline Phosphatase: 76 IU/L (ref 44–121)
BUN/Creatinine Ratio: 12 (ref 9–20)
BUN: 13 mg/dL (ref 6–24)
Bilirubin Total: 0.3 mg/dL (ref 0.0–1.2)
CO2: 23 mmol/L (ref 20–29)
Calcium: 9.5 mg/dL (ref 8.7–10.2)
Chloride: 103 mmol/L (ref 96–106)
Creatinine, Ser: 1.05 mg/dL (ref 0.76–1.27)
Globulin, Total: 2.3 g/dL (ref 1.5–4.5)
Glucose: 107 mg/dL — ABNORMAL HIGH (ref 70–99)
Potassium: 4.4 mmol/L (ref 3.5–5.2)
Sodium: 140 mmol/L (ref 134–144)
Total Protein: 6.7 g/dL (ref 6.0–8.5)
eGFR: 82 mL/min/{1.73_m2} (ref >59)

## 2022-11-29 LAB — HGB A1C W/O EAG: Hgb A1c MFr Bld: 5.6 % (ref 4.8–5.6)

## 2022-11-29 LAB — HIV ANTIBODY (ROUTINE TESTING W REFLEX): HIV Screen 4th Generation wRfx: NONREACTIVE

## 2022-11-29 LAB — TSH: TSH: 2.73 u[IU]/mL (ref 0.450–4.500)

## 2022-12-13 ENCOUNTER — Telehealth: Payer: Self-pay | Admitting: Family Medicine

## 2022-12-13 NOTE — Telephone Encounter (Signed)
Pt states that his PCP prescribed him a new medication at his last visit on 11/28/2022. Pt does not remember the name of the medication but states when he went to the Pharmacy to pick up the prescription he was told that the are out of stock in the 200 mg and it is on backorder until further notice. The Pharmacy told the pt that his insurance [employer] will only pay for 9 days worth of the prescription.  Per the Pharmacy they doe have it in stock in 550 mg but do not think [employer] will pay for this since they would only cover 9 days of the 200 mg. The Pharmacists advised for the pt to call his PCP back and recommends something different to be prescribed for the pt.    [id number] - [place], [place] - [address] AT [facility] OF [address] Phone: [phone]  Fax: [phone]       Please call pt back to discuss.

## 2022-12-13 NOTE — Telephone Encounter (Signed)
Believe patient is referring to rifaximin 200mg  per last office note. Please advise

## 2022-12-17 NOTE — Telephone Encounter (Signed)
I would recommend that he check with another pharmacy to see if they have it in stock.

## 2023-04-02 ENCOUNTER — Other Ambulatory Visit: Payer: Self-pay

## 2023-04-02 ENCOUNTER — Ambulatory Visit: Admission: RE | Admit: 2023-04-02 | Payer: Self-pay | Source: Ambulatory Visit

## 2023-04-02 DIAGNOSIS — Z87891 Personal history of nicotine dependence: Secondary | ICD-10-CM

## 2023-04-02 DIAGNOSIS — F17210 Nicotine dependence, cigarettes, uncomplicated: Secondary | ICD-10-CM

## 2023-04-02 DIAGNOSIS — Z122 Encounter for screening for malignant neoplasm of respiratory organs: Secondary | ICD-10-CM

## 2023-04-10 ENCOUNTER — Ambulatory Visit: Payer: Self-pay | Admitting: *Deleted

## 2023-04-10 NOTE — Telephone Encounter (Signed)
  Chief Complaint: Diverticulitis flare up Symptoms: Pain in LLQ of abd with diarrhea. Frequency: It's a constant issue but last few days it's been worse.   Pertinent Negatives: Patient denies blood in stool or fever. Disposition: [] ED /[] Urgent Care (no appt availability in office) / [x] Appointment(In office/virtual)/ []  [place] [facility]/ [] Home Care/ [] Refused Recommended Disposition /[] [place] [facility]/ []  Follow-up with PCP Additional Notes: Next available appt made with Dr. Herold for 04/18/2022 at 10:40.   He mentioned he may do a MyChart virtual visit however he wanted to keep this appt.    I'll see how I do.

## 2023-04-10 NOTE — Telephone Encounter (Signed)
 Message from Douglas C sent at 04/10/2023  8:15 AM EST  Summary: stomach discomfort / rx req   The patient would like to be prescribed antibiotics to help with their concerns related to a previous diverticulitis  The patient has recently began to experience stomach discomfort as well as diarrhea  Please contact the patient further when possible          Call History  Contact Date/Time Type Contact Phone/Fax By  04/10/2023 08:02 AM EST Phone (Incoming) [place], [place] (Self) [phone] (H) [place], [place] A   Reason for Disposition  Abdominal pain is a chronic symptom (recurrent or ongoing AND present > 4 weeks)  Answer Assessment - Initial Assessment Questions 1. LOCATION: Where does it hurt?      I'm having diarrhea and I have diverticulitis.   2. RADIATION: Does the pain shoot anywhere else? (e.g., chest, back)     I have pain in left lower abd and my IBS symptoms flaring up 3. ONSET: When did the pain begin? (Minutes, hours or days ago)      I have this all the time.   But right now it's bad. 4. SUDDEN: Gradual or sudden onset?     Not asked 5. PATTERN Does the pain come and go, or is it constant?    - If it comes and goes: How long does it last? Do you have pain now?     (Note: Comes and goes means the pain is intermittent. It goes away completely between bouts.)    - If constant: Is it getting better, staying the same, or getting worse?      (Note: Constant means the pain never goes away completely; most serious pain is constant and gets worse.)      Constantly. 6. SEVERITY: How bad is the pain?  (e.g., Scale 1-10; mild, moderate, or severe)    - MILD (1-3): Doesn't interfere with normal activities, abdomen soft and not tender to touch.     - MODERATE (4-7): Interferes with normal activities or awakens from sleep, abdomen tender to touch.     - SEVERE (8-10): Excruciating pain, doubled over, unable to do any normal activities.        Moderate 7. RECURRENT SYMPTOM: Have you ever had this type of stomach pain before? If Yes, ask: When was the last time? and What happened that time?      Yes   IBS 8. CAUSE: What do you think is causing the stomach pain?     IBS 9. RELIEVING/AGGRAVATING FACTORS: What makes it better or worse? (e.g., antacids, bending or twisting motion, bowel movement)     Not asked 10. OTHER SYMPTOMS: Do you have any other symptoms? (e.g., back pain, diarrhea, fever, urination pain, vomiting)       Diarrhea  Protocols used: Abdominal Pain - Male-A-AH

## 2023-04-19 ENCOUNTER — Ambulatory Visit: Payer: Self-pay | Admitting: Pediatrics

## 2023-04-23 ENCOUNTER — Ambulatory Visit: Payer: Self-pay

## 2023-05-10 ENCOUNTER — Telehealth: Payer: Self-pay | Admitting: Family Medicine

## 2023-05-10 NOTE — Telephone Encounter (Signed)
I am happy to put the referral in for him but it will likely take several weeks for them to see him- I'd advise him to come see me ASAP for [place] to see what's going on

## 2023-05-10 NOTE — Telephone Encounter (Signed)
Copied from CRM [id number]. Topic: Referral - Request for Referral >> May 10, 2023  9:28 AM Ethan Snyder wrote: Reason for CRM: Referral Request   Did the patient discuss referral with their provider in the last year? Yes  (If Yes - send message)  Appointment offered? No  Type of order/referral and detailed reason for visit: gastroenterology  Preference of office, provider, location: [facility], or one in [place]    If referral order, have you been seen by this specialty before? No (If Yes, this issue or another issue? When? Where?  Can we respond through MyChart? Yes

## 2023-05-10 NOTE — Telephone Encounter (Signed)
Can we please find out why he wants to go?

## 2023-05-10 NOTE — Telephone Encounter (Signed)
Left detailed message on machine for patient to call the office and schedule an appt.

## 2023-05-10 NOTE — Telephone Encounter (Signed)
Spoke with patient to follow up on the referral request. Patient says he is still having issues with his Diverticulitis and says it is causing his appetite to decrease. Patient says he has only eaten one meal this week on Tuesday evening and says he has been having diarrhea since then. Patient says he fees that his life is going down the toilet literally. Please advise?

## 2023-05-30 ENCOUNTER — Telehealth: Payer: Self-pay

## 2023-05-30 NOTE — Telephone Encounter (Signed)
 Left message and sent mychart message for patient due to needing to reschedule his appointment tomorrow as our office is on a weather delay.  His new appointment time is 06/01/2023 @ 1:20 pm however if this does not work for him please assist in finding a better time for him.

## 2023-05-31 ENCOUNTER — Ambulatory Visit: Payer: Self-pay | Admitting: Family Medicine

## 2023-06-01 ENCOUNTER — Ambulatory Visit: Payer: BC Managed Care – PPO | Admitting: Family Medicine

## 2023-06-01 ENCOUNTER — Encounter: Payer: Self-pay | Admitting: Family Medicine

## 2023-06-01 VITALS — BP 125/76 | HR 60 | Temp 98.9°F | Ht 71.0 in | Wt 195.4 lb

## 2023-06-01 DIAGNOSIS — K580 Irritable bowel syndrome with diarrhea: Secondary | ICD-10-CM | POA: Diagnosis not present

## 2023-06-01 DIAGNOSIS — E7849 Other hyperlipidemia: Secondary | ICD-10-CM | POA: Diagnosis not present

## 2023-06-01 DIAGNOSIS — I700 Atherosclerosis of aorta: Secondary | ICD-10-CM | POA: Diagnosis not present

## 2023-06-01 DIAGNOSIS — I10 Essential (primary) hypertension: Secondary | ICD-10-CM | POA: Diagnosis not present

## 2023-06-01 MED ORDER — AMLODIPINE BESYLATE 10 MG PO TABS: 10.0000 mg | ORAL_TABLET | Freq: Every day | ORAL | 1 refills | Status: DC

## 2023-06-01 MED ORDER — LOSARTAN POTASSIUM 50 MG PO TABS: 50.0000 mg | ORAL_TABLET | Freq: Every day | ORAL | 1 refills | Status: DC

## 2023-06-01 MED ORDER — ROSUVASTATIN CALCIUM 10 MG PO TABS
10.0000 mg | ORAL_TABLET | Freq: Every day | ORAL | 1 refills | Status: DC
Start: 2023-06-01 — End: 2023-12-04

## 2023-06-01 NOTE — Assessment & Plan Note (Signed)
 Carries a diagnosis of IBS-D but denies ever having stool studies or celiac panel. We will check these and get him back into his GI doctor. Await results.

## 2023-06-01 NOTE — Progress Notes (Signed)
 BP 125/76 (BP Location: Left Arm, Patient Position: Sitting, Cuff Size: Normal)   Pulse 60   Temp 98.9 F (37.2 C) (Oral)   Ht 5\' 11"  (1.803 m)   Wt 195 lb 6.4 oz (88.6 kg)   SpO2 98%   BMI 27.25 kg/m    Subjective:    Patient ID: Ethan Snyder, male    DOB: 05-16-1965, 58 y.o.   MRN: [id number]  HPI: Ethan Snyder is a 59 y.o. male  Chief Complaint  Patient presents with   GI Problem    Patient want to get referral to a specialist    Hypertension   Hyperlipidemia   ABDOMINAL ISSUES Duration: chronic, worse in the last 3 months Nature: diarrhea and hemorrhoid pain and cramping Location: lower abdomen both sides  Severity: 4/10  Radiation: no Episode duration: hours Frequency: intermittent Alleviating factors: unknown Aggravating factors: unknown Treatments attempted: none Constipation: no Diarrhea: yes Episodes of diarrhea/day: 3-12x a day Mucous in the stool: yes Heartburn: no Bloating:yes Flatulence: yes Nausea: no Vomiting: no Episodes of vomit/day: Melena or hematochezia: no Rash: no Jaundice: no Fever: no Weight loss: 6lbs in 6 months  HYPERTENSION / HYPERLIPIDEMIA Satisfied with current treatment? yes Duration of hypertension: chronic BP monitoring frequency: not checking BP medication side effects: no Past BP meds: amlodipine, losartan Duration of hyperlipidemia: chronic Cholesterol medication side effects: no Cholesterol supplements: none Past cholesterol medications: crestor Medication compliance: excellent compliance Aspirin: no Recent stressors: no Recurrent headaches: no Visual changes: no Palpitations: no Dyspnea: no Chest pain: no Lower extremity edema: no Dizzy/lightheaded: no  Relevant past medical, surgical, family and social history reviewed and updated as indicated. Interim medical history since our last visit reviewed. Allergies and medications reviewed and updated.  Review of Systems   Constitutional: Negative.   Respiratory: Negative.    Cardiovascular: Negative.   Gastrointestinal:  Positive for abdominal distention, abdominal pain, blood in stool and diarrhea. Negative for anal bleeding, constipation, nausea, rectal pain and vomiting.  Musculoskeletal: Negative.   Skin: Negative.   Psychiatric/Behavioral: Negative.      Per HPI unless specifically indicated above     Objective:    BP 125/76 (BP Location: Left Arm, Patient Position: Sitting, Cuff Size: Normal)   Pulse 60   Temp 98.9 F (37.2 C) (Oral)   Ht 5\' 11"  (1.803 m)   Wt 195 lb 6.4 oz (88.6 kg)   SpO2 98%   BMI 27.25 kg/m   Wt Readings from Last 3 Encounters:  06/01/23 195 lb 6.4 oz (88.6 kg)  11/28/22 201 lb 9.6 oz (91.4 kg)  08/24/22 206 lb 8 oz (93.7 kg)    Physical Exam Vitals and nursing note reviewed.  Constitutional:      General: He is not in acute distress.    Appearance: Normal appearance. He is normal weight. He is not ill-appearing, toxic-appearing or diaphoretic.  HENT:     Head: Normocephalic and atraumatic.     Right Ear: External ear normal.     Left Ear: External ear normal.     Nose: Nose normal.     Mouth/Throat:     Mouth: Mucous membranes are moist.     Pharynx: Oropharynx is clear.  Eyes:     General: No scleral icterus.       Right eye: No discharge.        Left eye: No discharge.     Extraocular Movements: Extraocular movements intact.     Conjunctiva/sclera: Conjunctivae normal.  Pupils: Pupils are equal, round, and reactive to light.  Cardiovascular:     Rate and Rhythm: Normal rate and regular rhythm.     Pulses: Normal pulses.     Heart sounds: Normal heart sounds. No murmur heard.    No friction rub. No gallop.  Pulmonary:     Effort: Pulmonary effort is normal. No respiratory distress.     Breath sounds: Normal breath sounds. No stridor. No wheezing, rhonchi or rales.  Chest:     Chest wall: No tenderness.  Musculoskeletal:        General: Normal  range of motion.     Cervical back: Normal range of motion and neck supple.  Skin:    General: Skin is warm and dry.     Capillary Refill: Capillary refill takes less than 2 seconds.     Coloration: Skin is not jaundiced or pale.     Findings: No bruising, erythema, lesion or rash.  Neurological:     General: No focal deficit present.     Mental Status: He is alert and oriented to person, place, and time. Mental status is at baseline.  Psychiatric:        Mood and Affect: Mood normal.        Behavior: Behavior normal.        Thought Content: Thought content normal.        Judgment: Judgment normal.     Results for orders placed or performed in visit on 11/28/22  Microalbumin, Urine Waived   Collection Time: 11/28/22 11:19 AM  Result Value Ref Range   Microalb, Ur Waived 10 0 - 19 mg/L   Creatinine, Urine Waived 50 10 - 300 mg/dL   Microalb/Creat Ratio <30 <30 mg/g  Comprehensive metabolic panel   Collection Time: 11/28/22 11:48 AM  Result Value Ref Range   Glucose 107 (H) 70 - 99 mg/dL   BUN 13 6 - 24 mg/dL   Creatinine, Ser 1.61 0.76 - 1.27 mg/dL   eGFR 82 >09 UE/AVW/0.98   BUN/Creatinine Ratio 12 9 - 20   Sodium 140 134 - 144 mmol/L   Potassium 4.4 3.5 - 5.2 mmol/L   Chloride 103 96 - 106 mmol/L   CO2 23 20 - 29 mmol/L   Calcium 9.5 8.7 - 10.2 mg/dL   Total Protein 6.7 6.0 - 8.5 g/dL   Albumin 4.4 3.8 - 4.9 g/dL   Globulin, Total 2.3 1.5 - 4.5 g/dL   Bilirubin Total 0.3 0.0 - 1.2 mg/dL   Alkaline Phosphatase 76 44 - 121 IU/L   AST 29 0 - 40 IU/L   ALT 26 0 - 44 IU/L  CBC with Differential/Platelet   Collection Time: 11/28/22 11:48 AM  Result Value Ref Range   WBC 5.9 3.4 - 10.8 x10E3/uL   RBC 5.48 4.14 - 5.80 x10E6/uL   Hemoglobin 15.2 13.0 - 17.7 g/dL   Hematocrit 11.9 14.7 - 51.0 %   MCV 85 79 - 97 fL   MCH 27.7 26.6 - 33.0 pg   MCHC 32.8 31.5 - 35.7 g/dL   RDW 82.9 56.2 - 13.0 %   Platelets 214 150 - 450 x10E3/uL   Neutrophils 66 Not Estab. %   Lymphs 24  Not Estab. %   Monocytes 8 Not Estab. %   Eos 1 Not Estab. %   Basos 1 Not Estab. %   Neutrophils Absolute 3.9 1.4 - 7.0 x10E3/uL   Lymphocytes Absolute 1.4 0.7 - 3.1 x10E3/uL   Monocytes  Absolute 0.5 0.1 - 0.9 x10E3/uL   EOS (ABSOLUTE) 0.0 0.0 - 0.4 x10E3/uL   Basophils Absolute 0.0 0.0 - 0.2 x10E3/uL   Immature Granulocytes 0 Not Estab. %   Immature Grans (Abs) 0.0 0.0 - 0.1 x10E3/uL  Lipid Panel w/o Chol/HDL Ratio   Collection Time: 11/28/22 11:48 AM  Result Value Ref Range   Cholesterol, Total 186 100 - 199 mg/dL   Triglycerides 54 0 - 149 mg/dL   HDL 60 >29 mg/dL   VLDL Cholesterol Cal 10 5 - 40 mg/dL   LDL Chol Calc (NIH) 518 (H) 0 - 99 mg/dL  PSA   Collection Time: 11/28/22 11:48 AM  Result Value Ref Range   Prostate Specific Ag, Serum 1.1 0.0 - 4.0 ng/mL  TSH   Collection Time: 11/28/22 11:48 AM  Result Value Ref Range   TSH 2.730 0.450 - 4.500 uIU/mL  HIV Antibody (routine testing w rflx)   Collection Time: 11/28/22 11:48 AM  Result Value Ref Range   HIV Screen 4th Generation wRfx Non Reactive Non Reactive  Hgb A1c w/o eAG   Collection Time: 11/28/22 11:48 AM  Result Value Ref Range   Hgb A1c MFr Bld 5.6 4.8 - 5.6 %      Assessment & Plan:   Problem List Items Addressed This Visit       Cardiovascular and Mediastinum   Primary hypertension   Under good control on current regimen. Continue current regimen. Continue to monitor. Call with any concerns. Refills given. Labs drawn today.      Relevant Medications   amLODipine (NORVASC) 10 MG tablet   losartan (COZAAR) 50 MG tablet   rosuvastatin (CRESTOR) 10 MG tablet   Other Relevant Orders   Comprehensive metabolic panel   Aortic atherosclerosis (HCC) - Primary   Will keep his BP and cholesterol under good control. Continue to monitor. Call with any concerns.       Relevant Medications   amLODipine (NORVASC) 10 MG tablet   losartan (COZAAR) 50 MG tablet   rosuvastatin (CRESTOR) 10 MG tablet   Other  Relevant Orders   Comprehensive metabolic panel     Digestive   Irritable bowel syndrome with diarrhea   Carries a diagnosis of IBS-D but denies ever having stool studies or celiac panel. We will check these and get him back into his GI doctor. Await results.       Relevant Orders   Ambulatory referral to Gastroenterology   CBC with Differential/Platelet   TSH   Celiac Disease Ab Screen w/Rfx   Fecal occult blood, imunochemical([facility])   Ova and parasite examination   Fecal leukocytes   Stool Culture   Stool C-Diff Toxin Assay     Other   Hyperlipidemia   Under good control on current regimen. Continue current regimen. Continue to monitor. Call with any concerns. Refills given. Labs drawn today.       Relevant Medications   amLODipine (NORVASC) 10 MG tablet   losartan (COZAAR) 50 MG tablet   rosuvastatin (CRESTOR) 10 MG tablet   Other Relevant Orders   Comprehensive metabolic panel   Lipid Panel w/o Chol/HDL Ratio     Follow up plan: Return in about 6 months (around 11/29/2023) for physical.

## 2023-06-01 NOTE — Assessment & Plan Note (Signed)
 Under good control on current regimen. Continue current regimen. Continue to monitor. Call with any concerns. Refills given. Labs drawn today.

## 2023-06-01 NOTE — Assessment & Plan Note (Signed)
Will keep his BP and cholesterol under good control. Continue to monitor. Call with any concerns.  

## 2023-06-03 ENCOUNTER — Encounter: Payer: Self-pay | Admitting: Family Medicine

## 2023-06-04 LAB — CBC WITH DIFFERENTIAL/PLATELET
Basophils Absolute: 0 10*3/uL (ref 0.0–0.2)
Basos: 1 %
EOS (ABSOLUTE): 0 10*3/uL (ref 0.0–0.4)
Eos: 0 %
Hematocrit: 46.3 % (ref 37.5–51.0)
Hemoglobin: 15.5 g/dL (ref 13.0–17.7)
Immature Grans (Abs): 0 10*3/uL (ref 0.0–0.1)
Immature Granulocytes: 0 %
Lymphocytes Absolute: 1.9 10*3/uL (ref 0.7–3.1)
Lymphs: 36 %
MCH: 28 pg (ref 26.6–33.0)
MCHC: 33.5 g/dL (ref 31.5–35.7)
MCV: 84 fL (ref 79–97)
Monocytes Absolute: 0.6 10*3/uL (ref 0.1–0.9)
Monocytes: 10 %
Neutrophils Absolute: 2.8 10*3/uL (ref 1.4–7.0)
Neutrophils: 53 %
Platelets: 205 10*3/uL (ref 150–450)
RBC: 5.53 x10E6/uL (ref 4.14–5.80)
RDW: 12.9 % (ref 11.6–15.4)
WBC: 5.3 10*3/uL (ref 3.4–10.8)

## 2023-06-04 LAB — CELIAC DISEASE AB SCREEN W/RFX
Antigliadin Abs, IgA: 2 U (ref 0–19)
IgA/Immunoglobulin A, Serum: 124 mg/dL (ref 90–386)

## 2023-06-04 LAB — LIPID PANEL W/O CHOL/HDL RATIO
Cholesterol, Total: 146 mg/dL (ref 100–199)
HDL: 64 mg/dL (ref >39)
LDL Chol Calc (NIH): 67 mg/dL (ref 0–99)
Triglycerides: 78 mg/dL (ref 0–149)
VLDL Cholesterol Cal: 15 mg/dL (ref 5–40)

## 2023-06-04 LAB — COMPREHENSIVE METABOLIC PANEL
ALT: 29 IU/L (ref 0–44)
AST: 30 IU/L (ref 0–40)
Albumin: 4.5 g/dL (ref 3.8–4.9)
Alkaline Phosphatase: 83 IU/L (ref 44–121)
BUN/Creatinine Ratio: 15 (ref 9–20)
BUN: 16 mg/dL (ref 6–24)
Bilirubin Total: 0.7 mg/dL (ref 0.0–1.2)
CO2: 25 mmol/L (ref 20–29)
Calcium: 9 mg/dL (ref 8.7–10.2)
Chloride: 100 mmol/L (ref 96–106)
Creatinine, Ser: 1.08 mg/dL (ref 0.76–1.27)
Globulin, Total: 2.2 g/dL (ref 1.5–4.5)
Glucose: 101 mg/dL — ABNORMAL HIGH (ref 70–99)
Potassium: 3.6 mmol/L (ref 3.5–5.2)
Sodium: 139 mmol/L (ref 134–144)
Total Protein: 6.7 g/dL (ref 6.0–8.5)
eGFR: 80 mL/min/{1.73_m2} (ref >59)

## 2023-06-04 LAB — TSH: TSH: 3.44 u[IU]/mL (ref 0.450–4.500)

## 2023-06-06 LAB — FECAL OCCULT BLOOD, IMMUNOCHEMICAL: Fecal Occult Bld: NEGATIVE

## 2023-06-07 ENCOUNTER — Ambulatory Visit: Payer: Self-pay | Admitting: Family Medicine

## 2023-06-07 NOTE — Telephone Encounter (Signed)
  Chief Complaint: requesting medication due to recent lab results from 06/04/23. Positive c diff. Symptoms: abdominal pain cramping , diarrhea 4-5 times last 24 hours. Not sleeping well. A lot of gas. Hx diverticulitis.  Frequency: since 06/01/23 Pertinent Negatives: Patient denies fever. No vomiting  Disposition: [] ED /[] Urgent Care (no appt availability in office) / [] Appointment(In office/virtual)/ []  [facility]/ [] Home Care/ [] Refused Recommended Disposition /[] [facility]/ [x]  Follow-up with PCP Additional Notes:   Please advise regarding lab results from 06/04/23. Positive c diff results. Please advise if appt needed. Last OV 06/01/23. Please advise.       Copied from CRM [id number]. Topic: Clinical - Red Word Triage >> Jun 07, 2023  8:58 AM Franchot Heidelberg wrote: Red Word that prompted transfer to Nurse Triage: Abdominal pain and diarrhea Reason for Disposition  [1] MODERATE pain (e.g., interferes with normal activities) AND [2] pain comes and goes (cramps) AND [3] present > 24 hours  (Exception: Pain with Vomiting or Diarrhea - see that Guideline.)  Answer Assessment - Initial Assessment Questions 1. LOCATION: "Where does it hurt?"      Low abdominal area 2. RADIATION: "Does the pain shoot anywhere else?" (e.g., chest, back)     Na  3. ONSET: "When did the pain begin?" (Minutes, hours or days ago)      On going x 15 years but recently prior to 06/01/23 4. SUDDEN: "Gradual or sudden onset?"     na 5. PATTERN "Does the pain come and go, or is it constant?"    - If it comes and goes: "How long does it last?" "Do you have pain now?"     (Note: Comes and goes means the pain is intermittent. It goes away completely between bouts.)    - If constant: "Is it getting better, staying the same, or getting worse?"      (Note: Constant means the pain never goes away completely; most serious pain is constant and gets worse.)      Comes and goes  6. SEVERITY: "How bad is the  pain?"  (e.g., Scale 1-10; mild, moderate, or severe)    - MILD (1-3): Doesn't interfere with normal activities, abdomen soft and not tender to touch.     - MODERATE (4-7): Interferes with normal activities or awakens from sleep, abdomen tender to touch.     - SEVERE (8-10): Excruciating pain, doubled over, unable to do any normal activities.       A lot of gas , diarrhea , night sweats, not sleeping  7. RECURRENT SYMPTOM: "Have you ever had this type of stomach pain before?" If Yes, ask: "When was the last time?" and "What happened that time?"      Yes hx diverticulitis 8. CAUSE: "What do you think is causing the stomach pain?"     Na  9. RELIEVING/AGGRAVATING FACTORS: "What makes it better or worse?" (e.g., antacids, bending or twisting motion, bowel movement)     Na  10. OTHER SYMPTOMS: "Do you have any other symptoms?" (e.g., back pain, diarrhea, fever, urination pain, vomiting)       Diarrhea, 4-5 times. Watery and loose/ pasty  poor appetite  Protocols used: Abdominal Pain - Male-A-AH

## 2023-06-08 ENCOUNTER — Ambulatory Visit (INDEPENDENT_AMBULATORY_CARE_PROVIDER_SITE_OTHER): Payer: BC Managed Care – PPO | Admitting: Pediatrics

## 2023-06-08 ENCOUNTER — Telehealth: Payer: BC Managed Care – PPO | Admitting: Physician Assistant

## 2023-06-08 ENCOUNTER — Encounter: Payer: Self-pay | Admitting: Pediatrics

## 2023-06-08 ENCOUNTER — Other Ambulatory Visit: Payer: Self-pay | Admitting: Family Medicine

## 2023-06-08 VITALS — BP 127/83 | HR 65 | Temp 97.9°F | Ht 71.0 in | Wt 198.8 lb

## 2023-06-08 DIAGNOSIS — A498 Other bacterial infections of unspecified site: Secondary | ICD-10-CM

## 2023-06-08 LAB — OVA AND PARASITE EXAMINATION

## 2023-06-08 LAB — FECAL LEUKOCYTES

## 2023-06-08 LAB — STOOL CULTURE: E coli, Shiga toxin Assay: NEGATIVE

## 2023-06-08 LAB — CLOSTRIDIUM DIFFICILE EIA: C difficile Toxins A+B, EIA: POSITIVE — AB

## 2023-06-08 MED ORDER — VANCOMYCIN HCL 125 MG PO CAPS: 125.0000 mg | ORAL_CAPSULE | Freq: Four times a day (QID) | ORAL | 0 refills | Status: AC

## 2023-06-08 NOTE — Progress Notes (Signed)
   Office Visit  BP 127/83 (BP Location: Left Arm, Patient Position: Sitting, Cuff Size: Normal)   Pulse 65   Temp 97.9 F (36.6 C) (Oral)   Ht 5\' 11"  (1.803 m)   Wt 198 lb 12.8 oz (90.2 kg)   SpO2 96%   BMI 27.73 kg/m    Subjective:    Patient ID: Ethan Snyder, male    DOB: [birth year], 58 y.o.   MRN: [id number]  HPI: Ethan Snyder is a 58 y.o. male  Chief Complaint  Patient presents with   Follow-up    No concerns   Pt left without being seen.  Jackolyn Confer, MD

## 2023-06-08 NOTE — Progress Notes (Signed)
 Because of your abdominal discomfort and positive c. Diff test, I feel your condition warrants further evaluation and I recommend that you be seen for a face to face visit. You will need to have an examination of your abdomen and may need labwork to check your kidney function and electrolytes prior to starting medication.    Please contact your primary care physician practice to be seen. Many offices offer virtual options to be seen via video if you are not comfortable going in person to a medical facility at this time.  You may also seek an in person evaluation at urgent care.  NOTE: You will NOT be charged for this eVisit.  If you do not have a PCP, [facility] offers a free physician referral service available at [phone]. Our trained staff has the experience, knowledge and resources to put you in touch with a physician who is right for you.    If you are having a true medical emergency please call 911.   Your e-visit answers were reviewed by a board certified advanced clinical practitioner to complete your personal care plan.  Thank you for using e-Visits.  Greater than 5 minutes, yet less than 10 minutes of time have been spend researching, coordinating, and implementing care for this patient today.

## 2023-06-12 ENCOUNTER — Encounter: Payer: Self-pay | Admitting: *Deleted

## 2023-06-12 NOTE — Progress Notes (Signed)
 Last read by Earnestine Mealing at 11:54 AM on 06/08/2023.

## 2023-06-22 ENCOUNTER — Telehealth: Payer: Self-pay

## 2023-06-22 ENCOUNTER — Telehealth (INDEPENDENT_AMBULATORY_CARE_PROVIDER_SITE_OTHER): Admitting: Family Medicine

## 2023-06-22 ENCOUNTER — Encounter: Payer: Self-pay | Admitting: Family Medicine

## 2023-06-22 VITALS — Wt 200.0 lb

## 2023-06-22 DIAGNOSIS — A0472 Enterocolitis due to Clostridium difficile, not specified as recurrent: Secondary | ICD-10-CM

## 2023-06-22 DIAGNOSIS — A498 Other bacterial infections of unspecified site: Secondary | ICD-10-CM

## 2023-06-22 NOTE — Progress Notes (Signed)
 Wt 200 lb (90.7 kg)   BMI 27.89 kg/m    Subjective:    Patient ID: Ethan Snyder, male    DOB: [birth year], 58 y.o.   MRN: [id number]  HPI: Ethan Snyder is a 58 y.o. male  Chief Complaint  Patient presents with   Cdiff    Feels like he was better towards end of last week. Back to multiple BM daily this week. Frustrated and wants it to get better.    C. Diff- Has 2 of the antibiotics left. Has not heard from GI.  Duration: chronic, worse in the last 3.5 months, diagnosed with C. Diff 2 weeks ago. Diarrhea: yes non-bloody  Episodes of diarrhea/day: 6+ Nausea:yes Vomiting: no Abdominal pain: gas pain, but none in the past couple day Fever: no Decreased appetite: yes Tolerating liquids: yes Foreign travel: no Similar illness in contacts: no Recent antibiotic use: yes Status: stable Treatments attempted: vancomycin   Relevant past medical, surgical, family and social history reviewed and updated as indicated. Interim medical history since our last visit reviewed. Allergies and medications reviewed and updated.  Review of Systems  Constitutional: Negative.   Respiratory: Negative.    Cardiovascular: Negative.   Gastrointestinal:  Positive for diarrhea. Negative for abdominal distention, abdominal pain, anal bleeding, blood in stool, constipation, nausea, rectal pain and vomiting.  Musculoskeletal: Negative.   Skin: Negative.   Neurological: Negative.   Psychiatric/Behavioral: Negative.      Per HPI unless specifically indicated above     Objective:    Wt 200 lb (90.7 kg)   BMI 27.89 kg/m   Wt Readings from Last 3 Encounters:  06/22/23 200 lb (90.7 kg)  06/08/23 198 lb 12.8 oz (90.2 kg)  06/01/23 195 lb 6.4 oz (88.6 kg)    Physical Exam Vitals and nursing note reviewed.  Constitutional:      General: He is not in acute distress.    Appearance: Normal appearance. He is not ill-appearing, toxic-appearing or diaphoretic.  HENT:     Head:  Normocephalic and atraumatic.     Right Ear: External ear normal.     Left Ear: External ear normal.     Nose: Nose normal.     Mouth/Throat:     Mouth: Mucous membranes are moist.     Pharynx: Oropharynx is clear.  Eyes:     General: No scleral icterus.       Right eye: No discharge.        Left eye: No discharge.     Conjunctiva/sclera: Conjunctivae normal.     Pupils: Pupils are equal, round, and reactive to light.  Pulmonary:     Effort: Pulmonary effort is normal. No respiratory distress.     Comments: Speaking in full sentences Musculoskeletal:        General: Normal range of motion.     Cervical back: Normal range of motion.  Skin:    Coloration: Skin is not jaundiced or pale.     Findings: No bruising, erythema, lesion or rash.  Neurological:     Mental Status: He is alert and oriented to person, place, and time. Mental status is at baseline.  Psychiatric:        Mood and Affect: Mood normal.        Behavior: Behavior normal.        Thought Content: Thought content normal.        Judgment: Judgment normal.     Results for orders placed or performed in visit  on 06/01/23  Comprehensive metabolic panel   Collection Time: 06/01/23  1:56 PM  Result Value Ref Range   Glucose 101 (H) 70 - 99 mg/dL   BUN 16 6 - 24 mg/dL   Creatinine, Ser 1.19 0.76 - 1.27 mg/dL   eGFR 80 >14 NW/GNF/6.21   BUN/Creatinine Ratio 15 9 - 20   Sodium 139 134 - 144 mmol/L   Potassium 3.6 3.5 - 5.2 mmol/L   Chloride 100 96 - 106 mmol/L   CO2 25 20 - 29 mmol/L   Calcium 9.0 8.7 - 10.2 mg/dL   Total Protein 6.7 6.0 - 8.5 g/dL   Albumin 4.5 3.8 - 4.9 g/dL   Globulin, Total 2.2 1.5 - 4.5 g/dL   Bilirubin Total 0.7 0.0 - 1.2 mg/dL   Alkaline Phosphatase 83 44 - 121 IU/L   AST 30 0 - 40 IU/L   ALT 29 0 - 44 IU/L  Lipid Panel w/o Chol/HDL Ratio   Collection Time: 06/01/23  1:56 PM  Result Value Ref Range   Cholesterol, Total 146 100 - 199 mg/dL   Triglycerides 78 0 - 149 mg/dL   HDL 64 >30  mg/dL   VLDL Cholesterol Cal 15 5 - 40 mg/dL   LDL Chol Calc (NIH) 67 0 - 99 mg/dL  CBC with Differential/Platelet   Collection Time: 06/01/23  1:56 PM  Result Value Ref Range   WBC 5.3 3.4 - 10.8 x10E3/uL   RBC 5.53 4.14 - 5.80 x10E6/uL   Hemoglobin 15.5 13.0 - 17.7 g/dL   Hematocrit 86.5 78.4 - 51.0 %   MCV 84 79 - 97 fL   MCH 28.0 26.6 - 33.0 pg   MCHC 33.5 31.5 - 35.7 g/dL   RDW 69.6 29.5 - 28.4 %   Platelets 205 150 - 450 x10E3/uL   Neutrophils 53 Not Estab. %   Lymphs 36 Not Estab. %   Monocytes 10 Not Estab. %   Eos 0 Not Estab. %   Basos 1 Not Estab. %   Neutrophils Absolute 2.8 1.4 - 7.0 x10E3/uL   Lymphocytes Absolute 1.9 0.7 - 3.1 x10E3/uL   Monocytes Absolute 0.6 0.1 - 0.9 x10E3/uL   EOS (ABSOLUTE) 0.0 0.0 - 0.4 x10E3/uL   Basophils Absolute 0.0 0.0 - 0.2 x10E3/uL   Immature Granulocytes 0 Not Estab. %   Immature Grans (Abs) 0.0 0.0 - 0.1 x10E3/uL  TSH   Collection Time: 06/01/23  1:56 PM  Result Value Ref Range   TSH 3.440 0.450 - 4.500 uIU/mL  Celiac Disease Ab Screen w/Rfx   Collection Time: 06/01/23  1:56 PM  Result Value Ref Range   Antigliadin Abs, IgA 2 0 - 19 units   Transglutaminase IgA <2 0 - 3 U/mL   IgA/Immunoglobulin A, Serum 124 90 - 386 mg/dL  Fecal occult blood, imunochemical([facility])   Collection Time: 06/04/23  8:11 AM   Specimen: Stool   ST  Result Value Ref Range   Fecal Occult Bld Negative Negative  Stool Culture   Collection Time: 06/04/23 11:49 AM   Specimen: Stool   ST     CD- [id number]  Result Value Ref Range   Salmonella/Shigella Screen Final report    Stool Culture result 1 ([facility]AS[facility]) Comment    Campylobacter Culture Final report    Stool Culture result 1 ([facility]MP[facility]) Comment    E coli, Shiga toxin Assay Negative Negative  Fecal leukocytes   Collection Time: 06/04/23 11:49 AM   Specimen: Stool   ST  CD- [id number]  Result Value Ref Range   White Blood Cells (WBC), Stool Final report None Seen   Result 1 Comment    Clostridium difficile EIA   Collection Time: 06/04/23 11:49 AM   ST     CD- [id number] V  Result Value Ref Range   C difficile Toxins A+B, EIA Positive (A) Negative  Ova and parasite examination   Collection Time: 06/04/23 11:49 AM   ST     CD- [id number] V  Result Value Ref Range   OVA + PARASITE EXAM Final report    O&P result 1 Comment       Assessment & Plan:   Problem List Items Addressed This Visit   None Visit Diagnoses       Clostridioides difficile infection    -  Primary   No better. Has 2 pills left of vanco. Will check repeat stool on Monday. Await GI appointment- checkin on today. Continue to monitor closely.   Relevant Orders   Stool C-Diff Toxin Assay        Follow up plan: Return Pending results..    This visit was completed via video visit through MyChart due to the restrictions of the COVID-19 pandemic. All issues as above were discussed and addressed. Physical exam was done as above through visual confirmation on video through MyChart. If it was felt that the patient should be evaluated in the office, they were directed there. The patient verbally consented to this visit. Location of the patient: home Location of the provider: work Those involved with this call:  Provider: Olevia Perches, DO CMA:  Arnetha Gula, CMA Front Desk/Registration: Servando Snare  Time spent on call:  15 minutes with patient face to face via video conference. More than 50% of this time was spent in counseling and coordination of care. 23 minutes total spent in review of patient's record and preparation of their chart.

## 2023-06-22 NOTE — Telephone Encounter (Signed)
 Attempted call to check patients referral status however office is currently closed. Will send mychart message to patient with referral information so that he may also start requesting follow up on status of his pending referral.   Also called to patient with the update and advised him that I sent the information via mychart so he would have it.

## 2023-06-30 LAB — CLOSTRIDIUM DIFFICILE EIA: C difficile Toxins A+B, EIA: NEGATIVE

## 2023-07-02 ENCOUNTER — Encounter: Payer: Self-pay | Admitting: Family Medicine

## 2023-08-09 ENCOUNTER — Encounter: Payer: Self-pay | Admitting: Acute Care

## 2023-11-26 ENCOUNTER — Ambulatory Visit
Admission: RE | Admit: 2023-11-26 | Discharge: 2023-11-26 | Disposition: A | Discharge status: Home / Self Care | Source: Ambulatory Visit | Attending: Acute Care | Admitting: Acute Care

## 2023-11-26 DIAGNOSIS — Z87891 Personal history of nicotine dependence: Secondary | ICD-10-CM | POA: Insufficient documentation

## 2023-11-26 DIAGNOSIS — Z122 Encounter for screening for malignant neoplasm of respiratory organs: Secondary | ICD-10-CM | POA: Insufficient documentation

## 2023-11-26 DIAGNOSIS — F17210 Nicotine dependence, cigarettes, uncomplicated: Secondary | ICD-10-CM | POA: Insufficient documentation

## 2023-12-04 ENCOUNTER — Ambulatory Visit (INDEPENDENT_AMBULATORY_CARE_PROVIDER_SITE_OTHER): Payer: BC Managed Care – PPO | Admitting: Family Medicine

## 2023-12-04 ENCOUNTER — Encounter: Payer: Self-pay | Admitting: Family Medicine

## 2023-12-04 ENCOUNTER — Other Ambulatory Visit: Payer: Self-pay

## 2023-12-04 VITALS — BP 121/77 | HR 45 | Temp 97.3°F | Ht 71.0 in | Wt 206.4 lb

## 2023-12-04 DIAGNOSIS — E7849 Other hyperlipidemia: Secondary | ICD-10-CM | POA: Diagnosis not present

## 2023-12-04 DIAGNOSIS — Z0000 Encounter for general adult medical examination without abnormal findings: Secondary | ICD-10-CM | POA: Diagnosis not present

## 2023-12-04 DIAGNOSIS — I700 Atherosclerosis of aorta: Secondary | ICD-10-CM

## 2023-12-04 DIAGNOSIS — I10 Essential (primary) hypertension: Secondary | ICD-10-CM | POA: Diagnosis not present

## 2023-12-04 DIAGNOSIS — K580 Irritable bowel syndrome with diarrhea: Secondary | ICD-10-CM | POA: Diagnosis not present

## 2023-12-04 LAB — MICROALBUMIN, URINE WAIVED
Creatinine, Urine Waived: 200 mg/dL (ref 10–300)
Microalb, Ur Waived: 30 mg/L — ABNORMAL HIGH (ref 0–19)
Microalb/Creat Ratio: <30 mg/g (ref <30)

## 2023-12-04 MED ORDER — ROSUVASTATIN CALCIUM 10 MG PO TABS: 10.0000 mg | ORAL_TABLET | Freq: Every day | ORAL | 1 refills | Status: DC

## 2023-12-04 MED ORDER — AMLODIPINE BESYLATE 10 MG PO TABS: 10.0000 mg | ORAL_TABLET | Freq: Every day | ORAL | 1 refills | Status: DC

## 2023-12-04 MED ORDER — LOSARTAN POTASSIUM 50 MG PO TABS: 50.0000 mg | ORAL_TABLET | Freq: Every day | ORAL | 1 refills | Status: DC

## 2023-12-04 MED ORDER — RIFAXIMIN 550 MG PO TABS
550.0000 mg | ORAL_TABLET | Freq: Three times a day (TID) | ORAL | 0 refills | Status: AC
  Filled 2023-12-04 – 2023-12-07 (×2): qty 42, 14d supply, fill #0

## 2023-12-04 NOTE — Assessment & Plan Note (Signed)
 Under good control on current regimen. Continue current regimen. Continue to monitor. Call with any concerns. Refills given. Labs drawn today.

## 2023-12-04 NOTE — Progress Notes (Signed)
 BP 121/77   Pulse (!) 45   Temp (!) 97.3 F (36.3 C) (Oral)   Ht 5' 11 (1.803 m)   Wt 206 lb 6.4 oz (93.6 kg)   SpO2 92%   BMI 28.79 kg/m    Subjective:    Patient ID: Ethan Snyder, male    DOB: 05/02/1965, 58 y.o.   MRN: [id number]  HPI: Ethan Snyder is a 58 y.o. male presenting on 12/04/2023 for comprehensive medical examination. Current medical complaints include:  Continues with chronic diarrhea. Has been seeing GI. Really effecting the quality of his life.   HYPERTENSION / HYPERLIPIDEMIA Satisfied with current treatment? yes Duration of hypertension: chronic BP monitoring frequency: not checking BP medication side effects: no Past BP meds: losartan , amlodipine  Duration of hyperlipidemia: chronic Cholesterol medication side effects: no Cholesterol supplements: none Past cholesterol medications: crestor  Medication compliance: excellent compliance Aspirin: no Recent stressors: no Recurrent headaches: no Visual changes: no Palpitations: no Dyspnea: no Chest pain: no Lower extremity edema: no Dizzy/lightheaded: no  Interim Problems from his last visit: no  Depression Screen done today and results listed below:     06/22/2023    1:03 PM 06/01/2023    1:41 PM 11/28/2022   11:20 AM 08/24/2022    9:29 AM 08/14/2022    2:09 PM  Depression screen PHQ 2/9  Decreased Interest 3 2 1 1  0  Down, Depressed, Hopeless 3 2 0 1 0  PHQ - 2 Score 6 4 1 2  0  Altered sleeping 3 2 1 1 1   Tired, decreased energy 3 2 2 1 1   Change in appetite 3 3 1 2 2   Feeling bad or failure about yourself  3 1 0 0 0  Trouble concentrating 3 1 0 1 0  Moving slowly or fidgety/restless 3 2 0 0 0  Suicidal thoughts 0 1 0 0 0  PHQ-9 Score 24 16 5 7 4   Difficult doing work/chores  Very difficult Not difficult at all Somewhat difficult Not difficult at all    Past Medical History:  Past Medical History:  Diagnosis Date   Allergy    Anxiety    Arthritis    DDD (degenerative  disc disease), lumbar    Depression    GERD (gastroesophageal reflux disease)    Hypertension     Surgical History:  Past Surgical History:  Procedure Laterality Date   COLONOSCOPY W/ POLYPECTOMY     approx 2012/2013   COLONOSCOPY WITH PROPOFOL  N/A 12/22/2021   Procedure: COLONOSCOPY WITH PROPOFOL ;  Surgeon: Jinny Carmine, MD;  Location: [facility];  Service: Endoscopy;  Laterality: N/A;   HERNIA REPAIR     POLYPECTOMY N/A 12/22/2021   Procedure: POLYPECTOMY;  Surgeon: Jinny Carmine, MD;  Location: [facility];  Service: Endoscopy;  Laterality: N/A;    Medications:  Current Outpatient Medications on File Prior to Visit  Medication Sig   diphenoxylate-atropine (LOMOTIL) 2.5-0.025 MG tablet Take 1 tablet by mouth 4 (four) times daily as needed.   fluticasone  (FLONASE ) 50 MCG/ACT nasal spray Place 2 sprays into the nose.   hyoscyamine (LEVSIN SL) 0.125 MG SL tablet Place under the tongue.   omeprazole (PRILOSEC OTC) 20 MG tablet Take 1 tablet by mouth daily.   No current facility-administered medications on file prior to visit.    Allergies:  Allergies  Allergen Reactions   Banana Itching, Swelling and Other (See Comments)    Feels tingling all over   Bee Venom Swelling   Lisinopril  Other reaction(s): Cough   Ibuprofen     Other reaction(s): Other (See Comments) Other Reaction: GI UPSET    Social History:  Social History   Socioeconomic History   Marital status: Single    Spouse name: Not on file   Number of children: Not on file   Years of education: Not on file   Highest education level: Some college, no degree  Occupational History   Not on file  Tobacco Use   Smoking status: Every Day    Current packs/day: 1.00    Average packs/day: 1 pack/day for 40.0 years (40.0 ttl pk-yrs)    Types: Cigarettes    Passive exposure: Current   Smokeless tobacco: Never  Vaping Use   Vaping status: Never Used  Substance and Sexual Activity   Alcohol use:  Yes    Alcohol/week: 12.0 standard drinks of alcohol    Types: 12 Cans of beer per week   Drug use: Never   Sexual activity: Not Currently  Other Topics Concern   Not on file  Social History Narrative   Not on file   Social Drivers of Health   Financial Resource Strain: Medium Risk (12/03/2023)   Overall Financial Resource Strain (CARDIA)    Difficulty of Paying Living Expenses: Somewhat hard  Food Insecurity: No Food Insecurity (12/03/2023)   Hunger Vital Sign    Worried About Running Out of Food in the Last Year: Never true    Ran Out of Food in the Last Year: Never true  Recent Concern: Food Insecurity - Food Insecurity Present (10/16/2023)   Received from [facility]   Hunger Vital Sign    Within the past 12 months, you worried that your food would run out before you got the money to buy more.: Sometimes true    Within the past 12 months, the food you bought just didn't last and you didn't have money to get more.: Never true  Transportation Needs: No Transportation Needs (12/03/2023)   PRAPARE - Administrator, Civil Service (Medical): No    Lack of Transportation (Non-Medical): No  Physical Activity: Insufficiently Active (12/03/2023)   Exercise Vital Sign    Days of Exercise per Week: 2 days    Minutes of Exercise per Session: 30 min  Stress: Stress Concern Present (12/03/2023)   [employer] of Occupational Health - Occupational Stress Questionnaire    Feeling of Stress: To some extent  Social Connections: Socially Isolated (12/03/2023)   Social Connection and Isolation Panel    Frequency of Communication with Friends and Family: Once a week    Frequency of Social Gatherings with Friends and Family: Never    Attends Religious Services: Never    Database administrator or Organizations: No    Attends Engineer, structural: Not on file    Marital Status: Divorced  Intimate Partner Violence: Not At Risk (12/04/2023)   Humiliation,  Afraid, Rape, and Kick questionnaire    Fear of Current or Ex-Partner: No    Emotionally Abused: No    Physically Abused: No    Sexually Abused: No   Social History   Tobacco Use  Smoking Status Every Day   Current packs/day: 1.00   Average packs/day: 1 pack/day for 40.0 years (40.0 ttl pk-yrs)   Types: Cigarettes   Passive exposure: Current  Smokeless Tobacco Never   Social History   Substance and Sexual Activity  Alcohol Use Yes   Alcohol/week: 12.0 standard drinks of alcohol  Types: 12 Cans of beer per week    Family History:  Family History  Problem Relation Age of Onset   Healthy Mother    Lung cancer Father    Cancer Sister        Liver and Brain   Heart disease Brother     Past medical history, surgical history, medications, allergies, family history and social history reviewed with patient today and changes made to appropriate areas of the chart.   Review of Systems  Constitutional: Negative.   HENT: Negative.    Eyes:  Positive for blurred vision. Negative for double vision, photophobia, pain, discharge and redness.  Respiratory:  Positive for cough and wheezing. Negative for hemoptysis, sputum production and shortness of breath.   Cardiovascular:  Positive for leg swelling. Negative for chest pain, palpitations, orthopnea, claudication and PND.  Gastrointestinal:  Positive for abdominal pain and diarrhea. Negative for blood in stool, constipation, heartburn, melena, nausea and vomiting.  Genitourinary: Negative.   Musculoskeletal:  Positive for back pain. Negative for falls, joint pain, myalgias and neck pain.  Skin: Negative.   Neurological: Negative.   Endo/Heme/Allergies:  Positive for environmental allergies. Negative for polydipsia. Bruises/bleeds easily.  Psychiatric/Behavioral: Negative.     All other ROS negative except what is listed above and in the HPI.      Objective:    BP 121/77   Pulse (!) 45   Temp (!) 97.3 F (36.3 C) (Oral)   Ht  5' 11 (1.803 m)   Wt 206 lb 6.4 oz (93.6 kg)   SpO2 92%   BMI 28.79 kg/m   Wt Readings from Last 3 Encounters:  12/04/23 206 lb 6.4 oz (93.6 kg)  06/22/23 200 lb (90.7 kg)  06/08/23 198 lb 12.8 oz (90.2 kg)    Physical Exam Vitals and nursing note reviewed.  Constitutional:      General: He is not in acute distress.    Appearance: Normal appearance. He is not ill-appearing, toxic-appearing or diaphoretic.  HENT:     Head: Normocephalic and atraumatic.     Right Ear: Tympanic membrane, ear canal and external ear normal. There is no impacted cerumen.     Left Ear: Tympanic membrane, ear canal and external ear normal. There is no impacted cerumen.     Nose: Nose normal. No congestion or rhinorrhea.     Mouth/Throat:     Mouth: Mucous membranes are moist.     Pharynx: Oropharynx is clear. No oropharyngeal exudate or posterior oropharyngeal erythema.  Eyes:     General: No scleral icterus.       Right eye: No discharge.        Left eye: No discharge.     Extraocular Movements: Extraocular movements intact.     Conjunctiva/sclera: Conjunctivae normal.     Pupils: Pupils are equal, round, and reactive to light.  Neck:     Vascular: No carotid bruit.  Cardiovascular:     Rate and Rhythm: Normal rate and regular rhythm.     Pulses: Normal pulses.     Heart sounds: No murmur heard.    No friction rub. No gallop.  Pulmonary:     Effort: Pulmonary effort is normal. No respiratory distress.     Breath sounds: Normal breath sounds. No stridor. No wheezing, rhonchi or rales.  Chest:     Chest wall: No tenderness.  Abdominal:     General: Abdomen is flat. Bowel sounds are normal. There is no distension.  Palpations: Abdomen is soft. There is no mass.     Tenderness: There is abdominal tenderness (diffusely). There is no right CVA tenderness, left CVA tenderness, guarding or rebound.     Hernia: No hernia is present.  Genitourinary:    Comments: Genital exam deferred with shared  decision making Musculoskeletal:        General: No swelling, tenderness, deformity or signs of injury.     Cervical back: Normal range of motion and neck supple. No rigidity. No muscular tenderness.     Right lower leg: No edema.     Left lower leg: No edema.  Lymphadenopathy:     Cervical: No cervical adenopathy.  Skin:    General: Skin is warm and dry.     Capillary Refill: Capillary refill takes less than 2 seconds.     Coloration: Skin is not jaundiced or pale.     Findings: No bruising, erythema, lesion or rash.  Neurological:     General: No focal deficit present.     Mental Status: He is alert and oriented to person, place, and time.     Cranial Nerves: No cranial nerve deficit.     Sensory: No sensory deficit.     Motor: No weakness.     Coordination: Coordination normal.     Gait: Gait normal.     Deep Tendon Reflexes: Reflexes normal.  Psychiatric:        Mood and Affect: Mood normal.        Behavior: Behavior normal.        Thought Content: Thought content normal.        Judgment: Judgment normal.     Results for orders placed or performed in visit on 06/22/23  Stool C-Diff Toxin Assay   Collection Time: 06/28/23  8:49 AM   Specimen: Stool   ST  Result Value Ref Range   C difficile Toxins A+B, EIA Negative Negative      Assessment & Plan:   Problem List Items Addressed This Visit       Cardiovascular and Mediastinum   Primary hypertension   Under good control on current regimen. Continue current regimen. Continue to monitor. Call with any concerns. Refills given. Labs drawn today.        Relevant Medications   amLODipine  (NORVASC ) 10 MG tablet   losartan  (COZAAR ) 50 MG tablet   rosuvastatin  (CRESTOR ) 10 MG tablet   Other Relevant Orders   Microalbumin, Urine Waived   Aortic atherosclerosis (HCC)   Will keep BP and cholesterol under good control. Call with any concerns. Continue to monitor.       Relevant Medications   amLODipine  (NORVASC ) 10 MG  tablet   losartan  (COZAAR ) 50 MG tablet   rosuvastatin  (CRESTOR ) 10 MG tablet     Digestive   Irritable bowel syndrome with diarrhea   Further work up with GI was normal. Previously diagnosed with IBS-D in 2007. Was not able to get xifaxin previously, but it sounds like insurance would cover it- will try sending it through again to [facility]. Recheck 3 months. Call with any concerns.       Relevant Medications   hyoscyamine (LEVSIN SL) 0.125 MG SL tablet   diphenoxylate-atropine (LOMOTIL) 2.5-0.025 MG tablet     Other   Hyperlipidemia   Under good control on current regimen. Continue current regimen. Continue to monitor. Call with any concerns. Refills given. Labs drawn today.       Relevant Medications   amLODipine  (NORVASC ) 10  MG tablet   losartan  (COZAAR ) 50 MG tablet   rosuvastatin  (CRESTOR ) 10 MG tablet   Other Visit Diagnoses       Routine general medical examination at a health care facility    -  Primary   Vaccines up to date/declined. Screening labs checked today. Colonoscopy up to date. Continue diet and exercise. Call with any concerns.   Relevant Orders   Comprehensive metabolic panel with GFR   CBC with Differential/Platelet   Lipid Panel w/o Chol/HDL Ratio   PSA   TSH   Hepatitis B surface antibody,quantitative        LABORATORY TESTING:  Health maintenance labs ordered today as discussed above.   The natural history of prostate cancer and ongoing controversy regarding screening and potential treatment outcomes of prostate cancer has been discussed with the patient. The meaning of a false positive PSA and a false negative PSA has been discussed. He indicates understanding of the limitations of this screening test and wishes to proceed with screening PSA testing.   IMMUNIZATIONS:   - Tdap: Tetanus vaccination status reviewed: last tetanus booster within 10 years. - Influenza: Refused - Pneumovax: Refused - Prevnar: Refused - COVID: Refused - HPV: Not  applicable - Shingrix vaccine: Refused  SCREENING: - Colonoscopy: Up to date  Discussed with patient purpose of the colonoscopy is to detect colon cancer at curable precancerous or early stages   PATIENT COUNSELING:    Sexuality: Discussed sexually transmitted diseases, partner selection, use of condoms, avoidance of unintended pregnancy  and contraceptive alternatives.   Advised to avoid cigarette smoking.  I discussed with the patient that most people either abstain from alcohol or drink within safe limits (<=14/week and <=4 drinks/occasion for males, <=7/weeks and <= 3 drinks/occasion for females) and that the risk for alcohol disorders and other health effects rises proportionally with the number of drinks per week and how often a drinker exceeds daily limits.  Discussed cessation/primary prevention of drug use and availability of treatment for abuse.   Diet: Encouraged to adjust caloric intake to maintain  or achieve ideal body weight, to reduce intake of dietary saturated fat and total fat, to limit sodium intake by avoiding high sodium foods and not adding table salt, and to maintain adequate dietary potassium and calcium  preferably from fresh fruits, vegetables, and low-fat dairy products.    stressed the importance of regular exercise  Injury prevention: Discussed safety belts, safety helmets, smoke detector, smoking near bedding or upholstery.   Dental health: Discussed importance of regular tooth brushing, flossing, and dental visits.   Follow up plan: NEXT PREVENTATIVE PHYSICAL DUE IN 1 YEAR. Return in about 3 months (around 03/05/2024).

## 2023-12-04 NOTE — Assessment & Plan Note (Signed)
 Will keep BP and cholesterol under good control. Call with any concerns. Continue to monitor.

## 2023-12-04 NOTE — Assessment & Plan Note (Signed)
 Further work up with GI was normal. Previously diagnosed with IBS-D in 2007. Was not able to get xifaxin previously, but it sounds like insurance would cover it- will try sending it through again to [facility]. Recheck 3 months. Call with any concerns.

## 2023-12-05 ENCOUNTER — Ambulatory Visit: Payer: Self-pay | Admitting: Family Medicine

## 2023-12-05 LAB — COMPREHENSIVE METABOLIC PANEL WITH GFR
ALT: 23 IU/L (ref 0–44)
AST: 24 IU/L (ref 0–40)
Albumin: 4.5 g/dL (ref 3.8–4.9)
Alkaline Phosphatase: 64 IU/L (ref 44–121)
BUN/Creatinine Ratio: 14 (ref 9–20)
BUN: 15 mg/dL (ref 6–24)
Bilirubin Total: 0.5 mg/dL (ref 0.0–1.2)
CO2: 25 mmol/L (ref 20–29)
Calcium: 9.4 mg/dL (ref 8.7–10.2)
Chloride: 103 mmol/L (ref 96–106)
Creatinine, Ser: 1.09 mg/dL (ref 0.76–1.27)
Globulin, Total: 2 g/dL (ref 1.5–4.5)
Glucose: 110 mg/dL — ABNORMAL HIGH (ref 70–99)
Potassium: 4.4 mmol/L (ref 3.5–5.2)
Sodium: 141 mmol/L (ref 134–144)
Total Protein: 6.5 g/dL (ref 6.0–8.5)
eGFR: 78 mL/min/1.73 (ref >59)

## 2023-12-05 LAB — CBC WITH DIFFERENTIAL/PLATELET
Basophils Absolute: 0 x10E3/uL (ref 0.0–0.2)
Basos: 1 %
EOS (ABSOLUTE): 0.1 x10E3/uL (ref 0.0–0.4)
Eos: 1 %
Hematocrit: 47.5 % (ref 37.5–51.0)
Hemoglobin: 15.2 g/dL (ref 13.0–17.7)
Immature Grans (Abs): 0 x10E3/uL (ref 0.0–0.1)
Immature Granulocytes: 0 %
Lymphocytes Absolute: 1.6 x10E3/uL (ref 0.7–3.1)
Lymphs: 21 %
MCH: 27.9 pg (ref 26.6–33.0)
MCHC: 32 g/dL (ref 31.5–35.7)
MCV: 87 fL (ref 79–97)
Monocytes Absolute: 0.6 x10E3/uL (ref 0.1–0.9)
Monocytes: 8 %
Neutrophils Absolute: 5 x10E3/uL (ref 1.4–7.0)
Neutrophils: 69 %
Platelets: 204 x10E3/uL (ref 150–450)
RBC: 5.45 x10E6/uL (ref 4.14–5.80)
RDW: 13.4 % (ref 11.6–15.4)
WBC: 7.2 x10E3/uL (ref 3.4–10.8)

## 2023-12-05 LAB — LIPID PANEL W/O CHOL/HDL RATIO
Cholesterol, Total: 133 mg/dL (ref 100–199)
HDL: 54 mg/dL (ref >39)
LDL Chol Calc (NIH): 67 mg/dL (ref 0–99)
Triglycerides: 54 mg/dL (ref 0–149)
VLDL Cholesterol Cal: 12 mg/dL (ref 5–40)

## 2023-12-05 LAB — PSA: Prostate Specific Ag, Serum: 0.8 ng/mL (ref 0.0–4.0)

## 2023-12-05 LAB — HEPATITIS B SURFACE ANTIBODY, QUANTITATIVE: Hepatitis B Surf Ab Quant: <3.5 m[IU]/mL — ABNORMAL LOW

## 2023-12-05 LAB — TSH: TSH: 2.16 u[IU]/mL (ref 0.450–4.500)

## 2023-12-06 ENCOUNTER — Telehealth: Payer: Self-pay

## 2023-12-06 ENCOUNTER — Other Ambulatory Visit (HOSPITAL_COMMUNITY): Payer: Self-pay

## 2023-12-06 NOTE — Telephone Encounter (Signed)
 Pharmacy Patient Advocate Encounter   Received notification from Onbase that prior authorization for Xifaxan  550MG  tablets  is required/requested.   Insurance verification completed.   The patient is insured through [facility] .   Per test claim: PA required; PA submitted to above mentioned insurance via Phone Key/confirmation #/[id number] Status is pending  *cost exceeds maximum, called and spoke to a representative, they will call us  back when a decision has been made

## 2023-12-07 ENCOUNTER — Other Ambulatory Visit (HOSPITAL_COMMUNITY): Payer: Self-pay

## 2023-12-07 ENCOUNTER — Other Ambulatory Visit: Payer: Self-pay

## 2023-12-12 ENCOUNTER — Other Ambulatory Visit: Payer: Self-pay | Admitting: Acute Care

## 2023-12-12 ENCOUNTER — Telehealth: Payer: Self-pay

## 2023-12-12 DIAGNOSIS — F17210 Nicotine dependence, cigarettes, uncomplicated: Secondary | ICD-10-CM

## 2023-12-12 DIAGNOSIS — Z122 Encounter for screening for malignant neoplasm of respiratory organs: Secondary | ICD-10-CM

## 2023-12-12 DIAGNOSIS — Z87891 Personal history of nicotine dependence: Secondary | ICD-10-CM

## 2023-12-12 NOTE — Telephone Encounter (Signed)
 Spoke with patient and reviewed recent Lung CT results. A result letter has also been sent to his mychart. He will complete an annual Lung CT again next year. He is aware of the aortic aneurysm and will follow up with Duwaine Louder, PCP to discuss if additional imaging/referral is warranted prior to next annual Lung CT. Pt states he is taking his amlodipine  and rosuvastatin . States his recent BP readings have been 130/80 or lower. Annual CT order placed.   Isaiah, RN

## 2023-12-17 ENCOUNTER — Encounter: Payer: Self-pay | Admitting: Family Medicine

## 2023-12-17 ENCOUNTER — Telehealth: Payer: Self-pay | Admitting: Family Medicine

## 2023-12-17 DIAGNOSIS — I714 Abdominal aortic aneurysm, without rupture, unspecified: Secondary | ICD-10-CM | POA: Insufficient documentation

## 2023-12-17 DIAGNOSIS — J449 Chronic obstructive pulmonary disease, unspecified: Secondary | ICD-10-CM | POA: Insufficient documentation

## 2023-12-17 NOTE — Telephone Encounter (Signed)
 Referral to vascular placed today

## 2023-12-17 NOTE — Telephone Encounter (Signed)
-----   Message from Nurse Isaiah SQUIBB sent at 12/12/2023  8:39 AM EDT ----- Good Morning! Results have been reviewed with the patient. He will complete an annual Lung CT again next year. Pt request results be sent to your office to advise if additional cardiac imaging/referral is needed prior to next years annual Lung CT to evaluate aortic aneurysm. Please let us  know if we can be of furhther assistance. Have a good day!  Isaiah, RN

## 2024-01-10 ENCOUNTER — Encounter (INDEPENDENT_AMBULATORY_CARE_PROVIDER_SITE_OTHER): Payer: Self-pay | Admitting: Vascular Surgery

## 2024-01-18 ENCOUNTER — Encounter (INDEPENDENT_AMBULATORY_CARE_PROVIDER_SITE_OTHER): Payer: Self-pay | Admitting: Vascular Surgery

## 2024-01-18 ENCOUNTER — Ambulatory Visit (INDEPENDENT_AMBULATORY_CARE_PROVIDER_SITE_OTHER): Admitting: Vascular Surgery

## 2024-01-18 VITALS — BP 130/82 | HR 58 | Resp 18 | Ht 71.5 in | Wt 203.8 lb

## 2024-01-18 DIAGNOSIS — I7121 Aneurysm of the ascending aorta, without rupture: Secondary | ICD-10-CM

## 2024-01-18 DIAGNOSIS — I10 Essential (primary) hypertension: Secondary | ICD-10-CM

## 2024-01-18 DIAGNOSIS — E7849 Other hyperlipidemia: Secondary | ICD-10-CM

## 2024-01-18 DIAGNOSIS — I712 Thoracic aortic aneurysm, without rupture, unspecified: Secondary | ICD-10-CM | POA: Insufficient documentation

## 2024-01-18 NOTE — Assessment & Plan Note (Signed)
 lipid control important in reducing the progression of atherosclerotic disease. Continue statin therapy

## 2024-01-18 NOTE — Progress Notes (Signed)
 Patient ID: Ethan Snyder, male   DOB: 29-May-1965, 58 y.o.   MRN: [id number]  Chief Complaint  Patient presents with   Establish Care    New patient consult CT done 12/11/23 AAA w/out rupture ref. Johnson    HPI Ethan Snyder is a 58 y.o. male.  I am asked to see the patient by Dr. Vicci for evaluation of an incidental finding of an aortic aneurysm.  This was seen on a recent CT scan of the chest done for lung cancer screening.  He has a significant previous tobacco history.  I have independently reviewed his CT scan.  His ascending thoracic aorta measured approximately 4.1 cm in maximal diameter.  He denies any aneurysm related symptoms such as chest or back pain or signs of peripheral embolization..     Past Medical History:  Diagnosis Date   Allergy    Anxiety    Arthritis    DDD (degenerative disc disease), lumbar    Depression    GERD (gastroesophageal reflux disease)    Hypertension     Past Surgical History:  Procedure Laterality Date   COLONOSCOPY W/ POLYPECTOMY     approx 2012/2013   COLONOSCOPY WITH PROPOFOL  N/A 12/22/2021   Procedure: COLONOSCOPY WITH PROPOFOL ;  Surgeon: Jinny Carmine, MD;  Location[facility];  Service: Endoscopy;  Laterality: N/A;   HERNIA REPAIR     POLYPECTOMY N/A 12/22/2021   Procedure: POLYPECTOMY;  Surgeon: Jinny Carmine, MD;  Location: [facility];  Service: Endoscopy;  Laterality: N/A;     Family History  Problem Relation Age of Onset   Healthy Mother    Lung cancer Father    Cancer Sister        Liver and Brain   Heart disease Brother       Social History   Tobacco Use   Smoking status: Every Day    Current packs/day: 1.00    Average packs/day: 1 pack/day for 40.0 years (40.0 ttl pk-yrs)    Types: Cigarettes    Passive exposure: Current   Smokeless tobacco: Never  Vaping Use   Vaping status: Never Used  Substance Use Topics   Alcohol use: Yes    Alcohol/week: 12.0 standard drinks of  alcohol    Types: 12 Cans of beer per week   Drug use: Never     Allergies  Allergen Reactions   Banana Itching, Swelling and Other (See Comments)    Feels tingling all over   Bee Venom Swelling   Lisinopril     Other reaction(s): Cough   Ibuprofen     Other reaction(s): Other (See Comments) Other Reaction: GI UPSET    Current Outpatient Medications  Medication Sig Dispense Refill   amLODipine  (NORVASC ) 10 MG tablet Take 1 tablet (10 mg total) by mouth daily. 90 tablet 1   diphenoxylate-atropine (LOMOTIL) 2.5-0.025 MG tablet Take 1 tablet by mouth 4 (four) times daily as needed.     fluticasone  (FLONASE ) 50 MCG/ACT nasal spray Place 2 sprays into the nose.     hyoscyamine (LEVSIN SL) 0.125 MG SL tablet Place under the tongue.     losartan  (COZAAR ) 50 MG tablet Take 1 tablet (50 mg total) by mouth daily. 90 tablet 1   omeprazole (PRILOSEC OTC) 20 MG tablet Take 1 tablet by mouth daily.     rosuvastatin  (CRESTOR ) 10 MG tablet Take 1 tablet (10 mg total) by mouth daily. 90 tablet 1   No current facility-administered medications for  this visit.      REVIEW OF SYSTEMS (Negative unless checked)  Constitutional: [] Weight loss  [] Fever  [] Chills Cardiac: [] Chest pain   [] Chest pressure   [] Palpitations   [] Shortness of breath when laying flat   [] Shortness of breath at rest   [] Shortness of breath with exertion. Vascular:  [] Pain in legs with walking   [] Pain in legs at rest   [] Pain in legs when laying flat   [] Claudication   [] Pain in feet when walking  [] Pain in feet at rest  [] Pain in feet when laying flat   [] History of DVT   [] Phlebitis   [] Swelling in legs   [] Varicose veins   [] Non-healing ulcers Pulmonary:   [] Uses home oxygen   [] Productive cough   [] Hemoptysis   [] Wheeze  [] COPD   [] Asthma Neurologic:  [] Dizziness  [] Blackouts   [] Seizures   [] History of stroke   [] History of TIA  [] Aphasia   [] Temporary blindness   [] Dysphagia   [] Weakness or numbness in arms   [] Weakness  or numbness in legs Musculoskeletal:  [x] Arthritis   [] Joint swelling   [] Joint pain   [] Low back pain Hematologic:  [] Easy bruising  [] Easy bleeding   [] Hypercoagulable state   [] Anemic  [] Hepatitis Gastrointestinal:  [] Blood in stool   [] Vomiting blood  [x] Gastroesophageal reflux/heartburn   [] Abdominal pain Genitourinary:  [] Chronic kidney disease   [] Difficult urination  [] Frequent urination  [] Burning with urination   [] Hematuria Skin:  [] Rashes   [] Ulcers   [] Wounds Psychological:  [x] History of anxiety   []  History of major depression.    Physical Exam BP 130/82 (BP Location: Left Arm, Patient Position: Sitting, Cuff Size: Normal)   Pulse (!) 58   Resp 18   Ht 5' 11.5 (1.816 m)   Wt 203 lb 12.8 oz (92.4 kg)   BMI 28.03 kg/m  Gen:  WD/WN, NAD Head: [place]/AT, No temporalis wasting.  Ear/Nose/Throat: Hearing grossly intact, nares w/o erythema or drainage, oropharynx w/o Erythema/Exudate Eyes: Conjunctiva clear, sclera non-icteric  Neck: trachea midline.  No JVD.  Pulmonary:  Good air movement, respirations not labored, no use of accessory muscles  Cardiac: RRR, no JVD Vascular:  Vessel Right Left  Radial Palpable Palpable                                   Gastrointestinal:. No masses, surgical incisions, or scars. Musculoskeletal: M/S 5/5 throughout.  Extremities without ischemic changes.  No deformity or atrophy.  No edema. Neurologic: Sensation grossly intact in extremities.  Symmetrical.  Speech is fluent. Motor exam as listed above. Psychiatric: Judgment intact, Mood & affect appropriate for pt's clinical situation. Dermatologic: No rashes or ulcers noted.  No cellulitis or open wounds.    Radiology No results found.  Labs Recent Results (from the past 2160 hours)  Microalbumin, Urine Waived     Status: Abnormal   Collection Time: 12/04/23  9:13 AM  Result Value Ref Range   Microalb, Ur Waived 30 (H) 0 - 19 mg/L   Creatinine, Urine Waived 200 10 - 300 mg/dL    Microalb/Creat Ratio <30 <30 mg/g    Comment:                              Abnormal:       30 - 300  High Abnormal:           >300   Comprehensive metabolic panel with GFR     Status: Abnormal   Collection Time: 12/04/23  9:14 AM  Result Value Ref Range   Glucose 110 (H) 70 - 99 mg/dL   BUN 15 6 - 24 mg/dL   Creatinine, Ser 8.90 0.76 - 1.27 mg/dL   eGFR 78 >40 fO/fpw/8.26   BUN/Creatinine Ratio 14 9 - 20   Sodium 141 134 - 144 mmol/L   Potassium 4.4 3.5 - 5.2 mmol/L   Chloride 103 96 - 106 mmol/L   CO2 25 20 - 29 mmol/L   Calcium  9.4 8.7 - 10.2 mg/dL   Total Protein 6.5 6.0 - 8.5 g/dL   Albumin 4.5 3.8 - 4.9 g/dL   Globulin, Total 2.0 1.5 - 4.5 g/dL   Bilirubin Total 0.5 0.0 - 1.2 mg/dL   Alkaline Phosphatase 64 44 - 121 IU/L   AST 24 0 - 40 IU/L   ALT 23 0 - 44 IU/L  CBC with Differential/Platelet     Status: None   Collection Time: 12/04/23  9:14 AM  Result Value Ref Range   WBC 7.2 3.4 - 10.8 x10E3/uL   RBC 5.45 4.14 - 5.80 x10E6/uL   Hemoglobin 15.2 13.0 - 17.7 g/dL   Hematocrit 52.4 62.4 - 51.0 %   MCV 87 79 - 97 fL   MCH 27.9 26.6 - 33.0 pg   MCHC 32.0 31.5 - 35.7 g/dL   RDW 86.5 88.3 - 84.5 %   Platelets 204 150 - 450 x10E3/uL   Neutrophils 69 Not Estab. %   Lymphs 21 Not Estab. %   Monocytes 8 Not Estab. %   Eos 1 Not Estab. %   Basos 1 Not Estab. %   Neutrophils Absolute 5.0 1.4 - 7.0 x10E3/uL   Lymphocytes Absolute 1.6 0.7 - 3.1 x10E3/uL   Monocytes Absolute 0.6 0.1 - 0.9 x10E3/uL   EOS (ABSOLUTE) 0.1 0.0 - 0.4 x10E3/uL   Basophils Absolute 0.0 0.0 - 0.2 x10E3/uL   Immature Granulocytes 0 Not Estab. %   Immature Grans (Abs) 0.0 0.0 - 0.1 x10E3/uL  Lipid Panel w/o Chol/HDL Ratio     Status: None   Collection Time: 12/04/23  9:14 AM  Result Value Ref Range   Cholesterol, Total 133 100 - 199 mg/dL   Triglycerides 54 0 - 149 mg/dL   HDL 54 >60 mg/dL   VLDL Cholesterol Cal 12 5 - 40 mg/dL   LDL Chol Calc (NIH) 67 0 - 99 mg/dL  PSA      Status: None   Collection Time: 12/04/23  9:14 AM  Result Value Ref Range   Prostate Specific Ag, Serum 0.8 0.0 - 4.0 ng/mL    Comment: Roche ECLIA methodology. According to the American Urological Association, Serum PSA should decrease and remain at undetectable levels after radical prostatectomy. The AUA defines biochemical recurrence as an initial PSA value 0.2 ng/mL or greater followed by a subsequent confirmatory PSA value 0.2 ng/mL or greater. Values obtained with different assay methods or kits cannot be used interchangeably. Results cannot be interpreted as absolute evidence of the presence or absence of malignant disease.   TSH     Status: None   Collection Time: 12/04/23  9:14 AM  Result Value Ref Range   TSH 2.160 0.450 - 4.500 uIU/mL  Hepatitis B surface antibody,quantitative     Status: Abnormal   Collection Time: 12/04/23  9:14 AM  Result Value Ref Range   Hepatitis B Surf Ab Quant <3.5 (L) Immunity>10 mIU/mL    Comment:   Status of Immunity                     Anti-HBs Level   ------------------                     -------------- Inconsistent with Immunity                  0.0 - 10.0 Consistent with Immunity                         >10.0     Assessment/Plan:  Aneurysm of thoracic aorta I have independently reviewed his CT scan.  His ascending thoracic aorta measured approximately 4.1 cm in maximal diameter.  We discussed the pathophysiology and natural history of thoracic aortic aneurysms.  We discussed the importance of tobacco cessation and blood pressure control to keep this from growing and enlarging over time.  We discussed at this size, no repair is needed and does not pose him any risk but if it grows, there is a risk of rupture and aortic valve insufficiency.  We will continue to follow this on an annual basis with CT scans of the chest.  Primary hypertension blood pressure control important in reducing the progression of atherosclerotic disease and  aneurysmal growth. On appropriate oral medications.   Hyperlipidemia lipid control important in reducing the progression of atherosclerotic disease. Continue statin therapy      Selinda Gu 01/18/2024, 12:25 PM   This note was created with Dragon medical transcription system.  Any errors from dictation are unintentional.

## 2024-01-18 NOTE — Assessment & Plan Note (Signed)
 I have independently reviewed his CT scan.  His ascending thoracic aorta measured approximately 4.1 cm in maximal diameter.  We discussed the pathophysiology and natural history of thoracic aortic aneurysms.  We discussed the importance of tobacco cessation and blood pressure control to keep this from growing and enlarging over time.  We discussed at this size, no repair is needed and does not pose him any risk but if it grows, there is a risk of rupture and aortic valve insufficiency.  We will continue to follow this on an annual basis with CT scans of the chest.

## 2024-01-18 NOTE — Assessment & Plan Note (Signed)
blood pressure control important in reducing the progression of atherosclerotic disease and aneurysmal growth. On appropriate oral medications.  

## 2024-02-01 ENCOUNTER — Telehealth: Payer: Self-pay

## 2024-02-01 NOTE — Telephone Encounter (Signed)
 Copied from CRM [id number]. Topic: General - Other >> Feb 01, 2024  9:07 AM Edsel HERO wrote: Patient called to advise provider that he has finished taking the rifaximin  (XIFAXAN ) 550 MG TABS tablet and reports that the symptoms were mildly worse while taking the medication and have not improved since stopping the medication. Please advise.

## 2024-02-04 NOTE — Telephone Encounter (Signed)
 I would advise him to call his GI doctor.

## 2024-03-11 ENCOUNTER — Ambulatory Visit: Admitting: Family Medicine

## 2024-03-11 ENCOUNTER — Encounter: Payer: Self-pay | Admitting: Family Medicine

## 2024-03-11 VITALS — BP 132/81 | HR 56 | Temp 98.0°F | Ht 71.5 in | Wt 209.0 lb

## 2024-03-11 DIAGNOSIS — K580 Irritable bowel syndrome with diarrhea: Secondary | ICD-10-CM | POA: Diagnosis not present

## 2024-03-11 DIAGNOSIS — E7849 Other hyperlipidemia: Secondary | ICD-10-CM | POA: Diagnosis not present

## 2024-03-11 DIAGNOSIS — I10 Essential (primary) hypertension: Secondary | ICD-10-CM

## 2024-03-11 DIAGNOSIS — Z789 Other specified health status: Secondary | ICD-10-CM | POA: Diagnosis not present

## 2024-03-11 MED ORDER — ROSUVASTATIN CALCIUM 10 MG PO TABS: 10.0000 mg | ORAL_TABLET | Freq: Every day | ORAL | 1 refills | Status: AC

## 2024-03-11 MED ORDER — LOSARTAN POTASSIUM 50 MG PO TABS: 50.0000 mg | ORAL_TABLET | Freq: Every day | ORAL | 1 refills | Status: AC

## 2024-03-11 MED ORDER — AMLODIPINE BESYLATE 10 MG PO TABS: 10.0000 mg | ORAL_TABLET | Freq: Every day | ORAL | 1 refills | Status: AC

## 2024-03-11 NOTE — Assessment & Plan Note (Signed)
 Under good control on current regimen. Continue current regimen. Continue to monitor. Call with any concerns. Refills given. Labs drawn today.

## 2024-03-11 NOTE — Assessment & Plan Note (Signed)
 Doing better. Worse on xifaxin. Continue to follow with GI. Call with any concerns.

## 2024-03-11 NOTE — Progress Notes (Signed)
 BP 132/81   Pulse (!) 56   Temp 98 F (36.7 C) (Oral)   Ht 5' 11.5 (1.816 m)   Wt 209 lb (94.8 kg)   SpO2 97%   BMI 28.74 kg/m    Subjective:    Patient ID: Ethan Snyder, male    DOB: 1965/09/14, 58 y.o.   MRN: [id number]  HPI: Ethan Snyder is a 58 y.o. male  Chief Complaint  Patient presents with   Irritable Bowel Syndrome   Hypertension   Hyperlipidemia   He notes that the xifaxin made his diarrhea worse. He notes that he's doing a bit better. Feeling better.   HYPERTENSION / HYPERLIPIDEMIA Satisfied with current treatment? yes Duration of hypertension: chronic BP monitoring frequency: not checking BP medication side effects: no Past BP meds: amlodipine , losartan  Duration of hyperlipidemia: chronic Cholesterol medication side effects: no Cholesterol supplements: none Past cholesterol medications: crestor  Medication compliance: excellent compliance Aspirin: no Recent stressors: no Recurrent headaches: no Visual changes: no Palpitations: no Dyspnea: no Chest pain: no Lower extremity edema: no Dizzy/lightheaded: no  Relevant past medical, surgical, family and social history reviewed and updated as indicated. Interim medical history since our last visit reviewed. Allergies and medications reviewed and updated.  Review of Systems  Constitutional: Negative.   Respiratory: Negative.    Cardiovascular: Negative.   Gastrointestinal:  Positive for diarrhea. Negative for abdominal distention, abdominal pain, anal bleeding, blood in stool, constipation, nausea, rectal pain and vomiting.  Musculoskeletal: Negative.   Skin: Negative.   Psychiatric/Behavioral: Negative.      Per HPI unless specifically indicated above     Objective:    BP 132/81   Pulse (!) 56   Temp 98 F (36.7 C) (Oral)   Ht 5' 11.5 (1.816 m)   Wt 209 lb (94.8 kg)   SpO2 97%   BMI 28.74 kg/m   Wt Readings from Last 3 Encounters:  03/11/24 209 lb (94.8 kg)   01/18/24 203 lb 12.8 oz (92.4 kg)  12/04/23 206 lb 6.4 oz (93.6 kg)    Physical Exam Vitals and nursing note reviewed.  Constitutional:      General: He is not in acute distress.    Appearance: Normal appearance. He is not ill-appearing, toxic-appearing or diaphoretic.  HENT:     Head: Normocephalic and atraumatic.     Right Ear: External ear normal.     Left Ear: External ear normal.     Nose: Nose normal.     Mouth/Throat:     Mouth: Mucous membranes are moist.     Pharynx: Oropharynx is clear.  Eyes:     General: No scleral icterus.       Right eye: No discharge.        Left eye: No discharge.     Extraocular Movements: Extraocular movements intact.     Conjunctiva/sclera: Conjunctivae normal.     Pupils: Pupils are equal, round, and reactive to light.  Cardiovascular:     Rate and Rhythm: Normal rate and regular rhythm.     Pulses: Normal pulses.     Heart sounds: Normal heart sounds. No murmur heard.    No friction rub. No gallop.  Pulmonary:     Effort: Pulmonary effort is normal. No respiratory distress.     Breath sounds: Normal breath sounds. No stridor. No wheezing, rhonchi or rales.  Chest:     Chest wall: No tenderness.  Musculoskeletal:        General: Normal range of  motion.     Cervical back: Normal range of motion and neck supple.  Skin:    General: Skin is warm and dry.     Capillary Refill: Capillary refill takes less than 2 seconds.     Coloration: Skin is not jaundiced or pale.     Findings: No bruising, erythema, lesion or rash.  Neurological:     General: No focal deficit present.     Mental Status: He is alert and oriented to person, place, and time. Mental status is at baseline.  Psychiatric:        Mood and Affect: Mood normal.        Behavior: Behavior normal.        Thought Content: Thought content normal.        Judgment: Judgment normal.     Results for orders placed or performed in visit on 12/04/23  Microalbumin, Urine Waived    Collection Time: 12/04/23  9:13 AM  Result Value Ref Range   Microalb, Ur Waived 30 (H) 0 - 19 mg/L   Creatinine, Urine Waived 200 10 - 300 mg/dL   Microalb/Creat Ratio <30 <30 mg/g  Comprehensive metabolic panel with GFR   Collection Time: 12/04/23  9:14 AM  Result Value Ref Range   Glucose 110 (H) 70 - 99 mg/dL   BUN 15 6 - 24 mg/dL   Creatinine, Ser 8.90 0.76 - 1.27 mg/dL   eGFR 78 >40 fO/fpw/8.26   BUN/Creatinine Ratio 14 9 - 20   Sodium 141 134 - 144 mmol/L   Potassium 4.4 3.5 - 5.2 mmol/L   Chloride 103 96 - 106 mmol/L   CO2 25 20 - 29 mmol/L   Calcium  9.4 8.7 - 10.2 mg/dL   Total Protein 6.5 6.0 - 8.5 g/dL   Albumin 4.5 3.8 - 4.9 g/dL   Globulin, Total 2.0 1.5 - 4.5 g/dL   Bilirubin Total 0.5 0.0 - 1.2 mg/dL   Alkaline Phosphatase 64 44 - 121 IU/L   AST 24 0 - 40 IU/L   ALT 23 0 - 44 IU/L  CBC with Differential/Platelet   Collection Time: 12/04/23  9:14 AM  Result Value Ref Range   WBC 7.2 3.4 - 10.8 x10E3/uL   RBC 5.45 4.14 - 5.80 x10E6/uL   Hemoglobin 15.2 13.0 - 17.7 g/dL   Hematocrit 52.4 62.4 - 51.0 %   MCV 87 79 - 97 fL   MCH 27.9 26.6 - 33.0 pg   MCHC 32.0 31.5 - 35.7 g/dL   RDW 86.5 88.3 - 84.5 %   Platelets 204 150 - 450 x10E3/uL   Neutrophils 69 Not Estab. %   Lymphs 21 Not Estab. %   Monocytes 8 Not Estab. %   Eos 1 Not Estab. %   Basos 1 Not Estab. %   Neutrophils Absolute 5.0 1.4 - 7.0 x10E3/uL   Lymphocytes Absolute 1.6 0.7 - 3.1 x10E3/uL   Monocytes Absolute 0.6 0.1 - 0.9 x10E3/uL   EOS (ABSOLUTE) 0.1 0.0 - 0.4 x10E3/uL   Basophils Absolute 0.0 0.0 - 0.2 x10E3/uL   Immature Granulocytes 0 Not Estab. %   Immature Grans (Abs) 0.0 0.0 - 0.1 x10E3/uL  Lipid Panel w/o Chol/HDL Ratio   Collection Time: 12/04/23  9:14 AM  Result Value Ref Range   Cholesterol, Total 133 100 - 199 mg/dL   Triglycerides 54 0 - 149 mg/dL   HDL 54 >60 mg/dL   VLDL Cholesterol Cal 12 5 - 40 mg/dL   LDL Chol  Calc (NIH) 67 0 - 99 mg/dL  PSA   Collection Time: 12/04/23   9:14 AM  Result Value Ref Range   Prostate Specific Ag, Serum 0.8 0.0 - 4.0 ng/mL  TSH   Collection Time: 12/04/23  9:14 AM  Result Value Ref Range   TSH 2.160 0.450 - 4.500 uIU/mL  Hepatitis B surface antibody,quantitative   Collection Time: 12/04/23  9:14 AM  Result Value Ref Range   Hepatitis B Surf Ab Quant <3.5 (L) Immunity>10 mIU/mL      Assessment & Plan:   Problem List Items Addressed This Visit       Cardiovascular and Mediastinum   Primary hypertension - Primary   Under good control on current regimen. Continue current regimen. Continue to monitor. Call with any concerns. Refills given. Labs drawn today.       Relevant Medications   amLODipine  (NORVASC ) 10 MG tablet   losartan  (COZAAR ) 50 MG tablet   rosuvastatin  (CRESTOR ) 10 MG tablet   Other Relevant Orders   CBC with Differential/Platelet   Comprehensive metabolic panel with GFR     Digestive   Irritable bowel syndrome with diarrhea   Doing better. Worse on xifaxin. Continue to follow with GI. Call with any concerns.       Relevant Orders   CBC with Differential/Platelet   Comprehensive metabolic panel with GFR     Other   Hyperlipidemia   Under good control on current regimen. Continue current regimen. Continue to monitor. Call with any concerns. Refills given. Labs drawn today.      Relevant Medications   amLODipine  (NORVASC ) 10 MG tablet   losartan  (COZAAR ) 50 MG tablet   rosuvastatin  (CRESTOR ) 10 MG tablet   Other Relevant Orders   CBC with Differential/Platelet   Comprehensive metabolic panel with GFR   Lipid Panel w/o Chol/HDL Ratio   Other Visit Diagnoses       Hepatitis B vaccination status unknown       Labs drawn today. Await results.   Relevant Orders   Hepatitis B surface antibody,quantitative        Follow up plan: Return in about 6 months (around 09/09/2024).

## 2024-03-12 ENCOUNTER — Ambulatory Visit: Payer: Self-pay | Admitting: Family Medicine

## 2024-03-12 LAB — CBC WITH DIFFERENTIAL/PLATELET
Basophils Absolute: 0 x10E3/uL (ref 0.0–0.2)
Basos: 1 %
EOS (ABSOLUTE): 0.1 x10E3/uL (ref 0.0–0.4)
Eos: 1 %
Hematocrit: 47.3 % (ref 37.5–51.0)
Hemoglobin: 15.3 g/dL (ref 13.0–17.7)
Immature Grans (Abs): 0 x10E3/uL (ref 0.0–0.1)
Immature Granulocytes: 0 %
Lymphocytes Absolute: 1.5 x10E3/uL (ref 0.7–3.1)
Lymphs: 22 %
MCH: 27.6 pg (ref 26.6–33.0)
MCHC: 32.3 g/dL (ref 31.5–35.7)
MCV: 85 fL (ref 79–97)
Monocytes Absolute: 0.6 x10E3/uL (ref 0.1–0.9)
Monocytes: 10 %
Neutrophils Absolute: 4.4 x10E3/uL (ref 1.4–7.0)
Neutrophils: 66 %
Platelets: 216 x10E3/uL (ref 150–450)
RBC: 5.54 x10E6/uL (ref 4.14–5.80)
RDW: 13.1 % (ref 11.6–15.4)
WBC: 6.6 x10E3/uL (ref 3.4–10.8)

## 2024-03-12 LAB — COMPREHENSIVE METABOLIC PANEL WITH GFR
ALT: 35 IU/L (ref 0–44)
AST: 34 IU/L (ref 0–40)
Albumin: 4.5 g/dL (ref 3.8–4.9)
Alkaline Phosphatase: 64 IU/L (ref 47–123)
BUN/Creatinine Ratio: 15 (ref 9–20)
BUN: 16 mg/dL (ref 6–24)
Bilirubin Total: 0.5 mg/dL (ref 0.0–1.2)
CO2: 24 mmol/L (ref 20–29)
Calcium: 9 mg/dL (ref 8.7–10.2)
Chloride: 102 mmol/L (ref 96–106)
Creatinine, Ser: 1.08 mg/dL (ref 0.76–1.27)
Globulin, Total: 2.1 g/dL (ref 1.5–4.5)
Glucose: 97 mg/dL (ref 70–99)
Potassium: 4.4 mmol/L (ref 3.5–5.2)
Sodium: 140 mmol/L (ref 134–144)
Total Protein: 6.6 g/dL (ref 6.0–8.5)
eGFR: 79 mL/min/1.73 (ref >59)

## 2024-03-12 LAB — LIPID PANEL W/O CHOL/HDL RATIO
Cholesterol, Total: 142 mg/dL (ref 100–199)
HDL: 61 mg/dL (ref >39)
LDL Chol Calc (NIH): 69 mg/dL (ref 0–99)
Triglycerides: 57 mg/dL (ref 0–149)
VLDL Cholesterol Cal: 12 mg/dL (ref 5–40)

## 2024-03-12 LAB — HEPATITIS B SURFACE ANTIBODY, QUANTITATIVE: Hepatitis B Surf Ab Quant: <3.5 m[IU]/mL — ABNORMAL LOW
